# Patient Record
Sex: Female | Born: 1975 | Race: Asian | Hispanic: No | Marital: Married | Smoking: Never smoker
Health system: Southern US, Community
[De-identification: ages and names within clinical notes are randomized; demographics above are authoritative.]

## PROBLEM LIST (undated history)

## (undated) DIAGNOSIS — F411 Generalized anxiety disorder: Secondary | ICD-10-CM

## (undated) DIAGNOSIS — G4700 Insomnia, unspecified: Secondary | ICD-10-CM

## (undated) HISTORY — DX: Insomnia, unspecified: G47.00

## (undated) HISTORY — DX: Generalized anxiety disorder: F41.1

---

## 2016-11-03 ENCOUNTER — Other Ambulatory Visit: Payer: Self-pay | Admitting: Nurse Practitioner

## 2016-11-03 DIAGNOSIS — Z1231 Encounter for screening mammogram for malignant neoplasm of breast: Secondary | ICD-10-CM

## 2016-12-02 ENCOUNTER — Ambulatory Visit (INDEPENDENT_AMBULATORY_CARE_PROVIDER_SITE_OTHER): Payer: BLUE CROSS/BLUE SHIELD | Admitting: Neurology

## 2016-12-02 ENCOUNTER — Encounter: Payer: Self-pay | Admitting: Neurology

## 2016-12-02 VITALS — BP 119/80 | HR 72 | Resp 20 | Ht 60.0 in | Wt 96.0 lb

## 2016-12-02 DIAGNOSIS — R002 Palpitations: Secondary | ICD-10-CM

## 2016-12-02 DIAGNOSIS — R51 Headache: Secondary | ICD-10-CM | POA: Diagnosis not present

## 2016-12-02 DIAGNOSIS — G478 Other sleep disorders: Secondary | ICD-10-CM | POA: Diagnosis not present

## 2016-12-02 DIAGNOSIS — R351 Nocturia: Secondary | ICD-10-CM

## 2016-12-02 DIAGNOSIS — G479 Sleep disorder, unspecified: Secondary | ICD-10-CM | POA: Diagnosis not present

## 2016-12-02 DIAGNOSIS — F419 Anxiety disorder, unspecified: Secondary | ICD-10-CM

## 2016-12-02 DIAGNOSIS — R519 Headache, unspecified: Secondary | ICD-10-CM

## 2016-12-02 DIAGNOSIS — R0789 Other chest pain: Secondary | ICD-10-CM

## 2016-12-02 NOTE — Progress Notes (Signed)
Subjective:    Patient ID: Colleen Doyle is a 41 y.o. female.  HPI     Huston Foley, MD, PhD [facility] [address] [address] [place], [place] [address]  Dear Colleen Doyle,   I saw your patient, Colleen Doyle, upon your kind request in my neurologic clinic today for initial consultation of her sleep disorder, including restless sleep, nonrestorative sleep, difficulty with sleep initiation and sleep maintenance. The patient is unaccompanied today. As you know, Colleen Doyle is a 41 year old right-handed woman with an underlying medical history of palpitation and anxiety, otherwise benign medical history, who reports not being a good sleeper for quite some time, probably years, but symptoms have been worse more recently in the past couple of months, she has had consistent problems with her sleep, she worries about not being able to sleep and she also has had other symptoms including chest pressure, palpitations, discomfort and mild morning headaches. She has nocturia about once or twice per night. She has eliminated caffeine. She does not smoke or drink alcohol. Her Epworth sleepiness score is 1 out of 24, her fatigue score is 45 out of 63. She does endorse stressors. In the past 4 years she has had more stress. She moved from [place]. She has been trying to learn [place]. 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 She is in the 2 year program currently, has interest in healthcare and perhaps becoming a physical therapist eventually. She has one 81 year old daughter. She goes to school 4 days a week between [facility] and her English classes. She tries to keep a consistent bedtime at 9 PM. About a month ago she started taking medication for sleep. She was tried on trazodone first for a few days, 50 mg then 100 mg but started having palpitations and nasal congestion so she stopped the medication and it also did not help. She started taking Ambien 5 mg generic and took it for  about 10 days which only helped her get about 3 hours of sleep. More recently, 2 nights ago she started Ambien CR generic, 6.25 mg strength, the first night she slept 4 hours and last night she slept 5 hours, reasonably well. She denies restless leg symptoms or leg twitching at night or parasomnias otherwise but is a restless sleeper and tosses and turns a lot. She is not noted to snore very heavily at all. In the past some years ago she tried melatonin 5 mg strength and took it for about a week which did not help. She has also tried p.m. type medications and NyQuil over-the-counter. Wakeup time is 5:30 AM. She has to get her daughter on the school bus. Some 2 days ago she also started medication for her anxiety, BuSpar 5 mg strength 1 pill twice daily. She is not sure if it has helped, yesterday she took it in the afternoon and then at 9 PM. I reviewed your office note from 10/29/2016, which you kindly included.   Her Past Medical History Is Significant For: Past Medical History:  Diagnosis Date  . Insomnia     Her Past Surgical History Is Significant For: No past surgical history on file.  Her Family History Is Significant For: No family history on file.  Her Social History Is Significant For: Social History   Social History  . Marital status: Single    Spouse name: N/A  . Number of children: N/A  . Years of education: N/A   Social History  Main Topics  . Smoking status: Never Smoker  . Smokeless tobacco: Never Used  . Alcohol use No  . Drug use: No  . Sexual activity: Not Asked   Other Topics Concern  . None   Social History Narrative  . None    Her Allergies Are:  Allergies  Allergen Reactions  . Sulfa Antibiotics   . Wine [Alcohol]   :   Her Current Medications Are:  Outpatient Encounter Prescriptions as of 12/02/2016  Medication Sig  . ascorbic acid (VITAMIN C) 100 MG tablet Take 100 mg by mouth daily.  . busPIRone (BUSPAR) 5 MG tablet Take 5 mg by mouth daily.   . Multiple Vitamin (MULTIVITAMIN) tablet Take 1 tablet by mouth daily.  Marland Kitchen zolpidem (AMBIEN CR) 6.25 MG CR tablet Take 6.25 mg by mouth at bedtime.  . [DISCONTINUED] calcium-vitamin D 250-100 MG-UNIT tablet Take 1 tablet by mouth 2 (two) times daily.  . [DISCONTINUED] traZODone (DESYREL) 50 MG tablet Take 50 mg by mouth at bedtime.  . [DISCONTINUED] zolpidem (AMBIEN) 5 MG tablet Take 5 mg by mouth at bedtime.   No facility-administered encounter medications on file as of 12/02/2016.   :  Review of Systems:  Out of a complete 14 point review of systems, all are reviewed and negative with the exception of these symptoms as listed below:  Review of Systems  Neurological: Positive for dizziness, weakness and headaches.       Pt presents today to discuss her lack of sleep. Pt says that she has had problems for the past 2 months with not sleeping well.  Epworth Sleepiness Scale 0= would never doze 1= slight chance of dozing 2= moderate chance of dozing 3= high chance of dozing  Sitting and reading: 0 Watching TV: 0 Sitting inactive in a public place (ex. Theater or meeting): 0 As a passenger in a car for an hour without a break: 1 Lying down to rest in the afternoon: 0 Sitting and talking to someone: 0 Sitting quietly after lunch (no alcohol): 0 In a car, while stopped in traffic: 0 Total: 1   Psychiatric/Behavioral: Positive for sleep disturbance.    Objective:  Neurologic Exam  Physical Exam Physical Examination:   Vitals:   12/02/16 0841  BP: 119/80  Pulse: 72  Resp: 20   General Examination: The patient is a very pleasant 41 y.o. female in no acute distress. She appears well-developed and well-nourished and very well groomed. y.o. female in no acute distress. She appears well-developed and well-nourished and very well groomed.   HEENT: Normocephalic, atraumatic, pupils are equal, round and reactive to light and accommodation. Funduscopic exam is normal with sharp disc margins noted. Extraocular tracking is good without limitation to gaze excursion or nystagmus noted.  Normal smooth pursuit is noted. Hearing is grossly intact. Face is symmetric with normal facial animation and normal facial sensation. Speech is clear with no dysarthria noted. There is no hypophonia. There is no lip, neck/head, jaw or voice tremor. Neck is supple with full range of passive and active motion. There are no carotid bruits on auscultation. Oropharynx exam reveals: mild mouth dryness, good dental hygiene and mild airway crowding, due to smaller airway, small tonsils. Mallampati is class I. Tongue protrudes centrally and palate elevates symmetrically. Neck size is 11.5 inches. She has a Mild overbite.   Chest: Clear to auscultation without wheezing, rhonchi or crackles noted.  Heart: S1+S2+0, regular and normal without murmurs, rubs or gallops noted.   Abdomen: Soft, non-tender and non-distended with normal bowel sounds appreciated on auscultation.  Extremities: There is no pitting  edema in the distal lower extremities bilaterally. Pedal pulses are intact.  Skin: Warm and dry without trophic changes noted.  Musculoskeletal: exam reveals no obvious joint deformities, tenderness or joint swelling or erythema.   Neurologically:  Mental status: The patient is awake, alert and oriented in all 4 spheres. Her immediate and remote memory, attention, language skills and fund of knowledge are appropriate. There is no evidence of aphasia, agnosia, apraxia or anomia. Speech is clear with normal prosody and enunciation. Thought process is linear. Mood is normal and affect is anxious.  Cranial nerves II - XII are as described above under HEENT exam. In addition: shoulder shrug is normal with equal shoulder height noted. Motor exam: Normal bulk, strength and tone is noted. There is no drift, tremor or rebound. Romberg is negative. Reflexes are 2+ throughout. Babinski: Toes are flexor bilaterally. Fine motor skills and coordination: intact with normal finger taps, normal hand movements, normal rapid  alternating patting, normal foot taps and normal foot agility.  Cerebellar testing: No dysmetria or intention tremor on finger to nose testing. Heel to shin is unremarkable bilaterally. There is no truncal or gait ataxia.  Sensory exam: intact to light touch, pinprick, vibration, temperature sense in the upper and lower extremities.  Gait, station and balance: She stands easily. No veering to one side is noted. No leaning to one side is noted. Posture is age-appropriate and stance is narrow based. Gait shows normal stride length and normal pace. No problems turning are noted. Tandem walk is unremarkable.   Assessment and Plan:  In summary, Colleen Doyle is a very pleasant 41 y.o.-year old female with an Underlying benign medical history except palpitation and anxiety, who reports a long-standing history of difficulty with her sleep, more recent exacerbation of sleep maintenance and initiation issues, in addition, she reports palpitations at night, worsening anxiety, restless sleep and daytime tiredness. I would like to proceed with further evaluation in the form of sleep study testing. She has experienced chest discomfort intermittently. She also reports nocturia. She has woken up with a headache. The symptoms justify sleep study testing to look for an underlying organic cause for her symptoms. She can continue taking her generic Ambien CR which she started just a few days ago. She is advised to try to take her BuSpar in the morning and afternoon rather than afternoon and night. Her physical exam and neurological exam are nonfocal and she is reassured in that regard. We talked about maintaining good sleep hygiene. We talked about stressors. Addressing her stress and anxiety will likely help.  I will see her back after sleep study testing is completed. I answered all her questions today and she was in agreement with the plan.  Thank you very much for allowing me to participate in the care of this nice patient. If  I can be of any further assistance to you please do not hesitate to call me at [phone].  Sincerely,   Huston FoleySaima Brayln Duque, MD, PhD

## 2016-12-02 NOTE — Patient Instructions (Addendum)
Please remember to try to maintain good sleep hygiene, which means: Keep a regular sleep and wake schedule, try not to exercise or have a meal within 2 hours of your bedtime, try to keep your bedroom conducive for sleep, that is, cool and dark, without light distractors such as an illuminated alarm clock, and refrain from watching TV right before sleep or in the middle of the night and do not keep the TV or radio on during the night. Also, try not to use or play on electronic devices at bedtime, such as your cell phone, tablet PC or laptop. If you like to read at bedtime on an electronic device, try to dim the background light as much as possible. Do not eat in the middle of the night.   We will request a sleep study. We will call your you to schedule.     We will look for leg twitching and snoring or sleep apnea.   You can continue your Ambien CR 6.25 mg at night as needed; you can bring the medication for your sleep study.   Try taking your buspirone 5 mg in morning and afternoon.   For chronic insomnia, cognitive behavioral therapy through a psychiatrist and/or sleep psychologist is indicated and proven to be successful as a first line therapy.   We will call you with the sleep study results and make a follow up appointment if needed.

## 2016-12-21 ENCOUNTER — Institutional Professional Consult (permissible substitution): Payer: BLUE CROSS/BLUE SHIELD | Admitting: Neurology

## 2017-01-15 ENCOUNTER — Ambulatory Visit (INDEPENDENT_AMBULATORY_CARE_PROVIDER_SITE_OTHER): Payer: BLUE CROSS/BLUE SHIELD | Admitting: Neurology

## 2017-01-15 DIAGNOSIS — G478 Other sleep disorders: Secondary | ICD-10-CM | POA: Diagnosis not present

## 2017-01-15 DIAGNOSIS — G4720 Circadian rhythm sleep disorder, unspecified type: Secondary | ICD-10-CM

## 2017-01-22 NOTE — Progress Notes (Signed)
Patient referred by Ms. Anderson, NP, seen by me on 12/02/16, diagnostic PSG on 01/15/17.   Please call and notify the patient that the recent sleep study did not show any significant obstructive sleep apnea, no significant snoring, no significant desaturations, no leg movements or parasomnias. Benign study, achieved all stages of sleep. She can follow up with her referring provider. Please remind patient to try to maintain good sleep hygiene, which means: Keep a regular sleep and wake schedule, try not to exercise or have a meal within 2 hours of your bedtime, try to keep your bedroom conducive for sleep, that is, cool and dark, without light distractors such as an illuminated alarm clock, and refrain from watching TV right before sleep or in the middle of the night and do not keep the TV or radio on during the night. Also, try not to use or play on electronic devices at bedtime, such as your cell phone, tablet PC or laptop. If you like to read at bedtime on an electronic device, try to dim the background light as much as possible. Do not eat in the middle of the night.   Once you have spoken to patient, you can close this encounter.   Thanks,  Huston Foley, MD, PhD [facility])

## 2017-01-22 NOTE — Procedures (Signed)
PATIENT'S NAME:  Colleen Doyle, Colleen Doyle DOB:      1976-01-17      MR#:    [id number]     DATE OF RECORDING: 01/15/2017 REFERRING M.D.:  Elizabeth Palau FNP Study Performed:   Baseline Polysomnogram HISTORY: 41 year old woman with a medical history of palpitation and anxiety, who reports not being a good sleeper for quite some time, probably years, but symptoms have been worse more recently. Other symptoms include chest pressure, palpitations, discomfort and mild morning headaches. She has nocturia about once or twice per night. She has eliminated caffeine. She does not smoke or drink alcohol. Her Epworth sleepiness score is 1 out of 24, her fatigue score is 45 out of 63. The patient's weight 97 pounds with a height of 60 (inches), resulting in a BMI of 19. kg/m2. The patient's neck circumference measured 11.5 inches.  CURRENT MEDICATIONS: Buspar, Ambien   PROCEDURE:  This is a multichannel digital polysomnogram utilizing the Somnostar 11.2 system.  Electrodes and sensors were applied and monitored per AASM Specifications.   EEG, EOG, Chin and Limb EMG, were sampled at 200 Hz.  ECG, Snore and Nasal Pressure, Thermal Airflow, Respiratory Effort, CPAP Flow and Pressure, Oximetry was sampled at 50 Hz. Digital video and audio were recorded.      BASELINE STUDY  Lights Out was at 22:41 and Lights On at 05:02.  Total recording time (TRT) was 381.5 minutes, with a total sleep time (TST) of  318.5 minutes.   The patient's sleep latency was 13 minutes, which is normal.  REM latency was 83.5 minutes which is normal.  The sleep efficiency was 83.5 %.     SLEEP ARCHITECTURE: WASO (Wake after sleep onset) was 53.5 minutes with mild to moderate sleep fragmentation noted.  There were 57.5 minutes in Stage N1, 148.5 minutes Stage N2, 49 minutes Stage N3 and 63.5 minutes in Stage REM.  The percentage of Stage N1 was 18.1%, which is increased, Stage N2 was 46.6%, which is normal, Stage N3 was 15.4%, which is normal and Stage R (REM  sleep) was 19.9%, which is normal.   The arousals were noted as: 51 were spontaneous, 0 were associated with PLMs, 0 were associated with respiratory events.    Audio and video analysis did not show any abnormal or unusual movements, behaviors, phonations or vocalizations.  The patient took 1 bathroom break. No significant snoring was noted. The EKG was in keeping with normal sinus rhythm (NSR).  RESPIRATORY ANALYSIS:  There were a total of 0 respiratory events:  0 obstructive apneas, 0 central apneas and 0 mixed apneas with a total of 0 apneas and an apnea index (AI) of 0 /hour. There were 0 hypopneas with a hypopnea index of 0 /hour. The patient also had 0 respiratory event related arousals (RERAs).      The total APNEA/HYPOPNEA INDEX (AHI) was 0/hour and the total RESPIRATORY DISTURBANCE INDEX was 0 /hour.  0 events occurred in REM sleep and 0 events in NREM. The REM AHI was 0 /hour, versus a non-REM AHI of 0. The patient spent 98 minutes of total sleep time in the supine position and 221 minutes in non-supine.. The supine AHI was 0.0 versus a non-supine AHI of 0.0.  OXYGEN SATURATION & C02:  The Wake baseline 02 saturation was 98%, with the lowest being 94%. Time spent below 89% saturation equaled 0 minutes.  PERIODIC LIMB MOVEMENTS: The patient had a total of 0 Periodic Limb Movements.  The Periodic Limb Movement (PLM) index  was 0 and the PLM Arousal index was 0/hour.  Post-study, the patient indicated that sleep was the same as usual.   IMPRESSION:  1. Dysfunctions associated with sleep stages or arousal from sleep  RECOMMENDATIONS:  1. This study does not demonstrate any significant obstructive or central sleep disordered breathing, no significant snoring, no significant desaturations, no leg movements or parasomnias. This study does not support an intrinsic sleep disorder as a cause of the patient's symptoms. Other causes, including circadian rhythm disturbances, an underlying mood  disorder, medication effect and/or an underlying medical problem cannot be ruled out. 2. This study shows some sleep fragmentation and mildly abnormal sleep stage percentages; these are nonspecific findings and per se do not signify an intrinsic sleep disorder or a cause for the patient's sleep-related symptoms. Causes include (but are not limited to) the first night effect of the sleep study, circadian rhythm disturbances, medication effect or an underlying mood disorder (with possible sleep state misperception) or medical problem.  3. The patient should be cautioned not to drive, work at heights, or operate dangerous or heavy equipment when tired or sleepy. Review and reiteration of good sleep hygiene measures should be pursued with any patient. 4. The patient can follow-up with her referring provider, who will be notified of the test results.  I certify that I have reviewed the entire raw data recording prior to the issuance of this report in accordance with the Standards of Accreditation of the American Academy of Sleep Medicine (AASM)   Huston Foley, MD, PhD Diplomat, American Board of Psychiatry and Neurology (Neurology and Sleep Medicine)

## 2017-01-27 ENCOUNTER — Telehealth: Payer: Self-pay

## 2017-01-27 NOTE — Telephone Encounter (Signed)
I called pt. I advised pt that Dr. Frances Furbish reviewed pt's sleep study and found that pt did not have any significant sleep apnea, no snoring, desaturations, leg movements, or parasomnias. Pt achieved all stages of sleep and can follow up with the referring provider. I reviewed sleep hygiene recommendations with the pt, including trying to keep a regular sleep wake schedule, avoiding electronics in the bedroom, keeping the bedroom cool, dark, and quiet, and avoiding eating or exercising within 2 hours of bedtime as well as eating in the middle of the night. I advised pt that a copy of these sleep study results will be sent to Elizabeth Palau, NP. Pt asked that I mail her a copy of her sleep study and I confirmed that the address we have on file is correct. Pt verbalized understanding of results. Pt had no questions at this time but was encouraged to call back if questions arise.

## 2017-01-27 NOTE — Telephone Encounter (Signed)
-----   Message from Huston Foley, MD sent at 01/22/2017 12:57 PM EDT ----- Patient referred by Ms. Anderson, NP, seen by me on 12/02/16, diagnostic PSG on 01/15/17.   Please call and notify the patient that the recent sleep study did not show any significant obstructive sleep apnea, no significant snoring, no significant desaturations, no leg movements or parasomnias. Benign study, achieved all stages of sleep. She can follow up with her referring provider. Please remind patient to try to maintain good sleep hygiene, which means: Keep a regular sleep and wake schedule, try not to exercise or have a meal within 2 hours of your bedtime, try to keep your bedroom conducive for sleep, that is, cool and dark, without light distractors such as an illuminated alarm clock, and refrain from watching TV right before sleep or in the middle of the night and do not keep the TV or radio on during the night. Also, try not to use or play on electronic devices at bedtime, such as your cell phone, tablet PC or laptop. If you like to read at bedtime on an electronic device, try to dim the background light as much as possible. Do not eat in the middle of the night.   Once you have spoken to patient, you can close this encounter.   Thanks,  Huston Foley, MD, PhD [facility])

## 2017-03-03 ENCOUNTER — Ambulatory Visit
Admission: RE | Admit: 2017-03-03 | Discharge: 2017-03-03 | Disposition: A | Discharge status: Home / Self Care | Payer: BLUE CROSS/BLUE SHIELD | Source: Ambulatory Visit | Attending: Nurse Practitioner | Admitting: Nurse Practitioner

## 2017-03-03 DIAGNOSIS — Z1231 Encounter for screening mammogram for malignant neoplasm of breast: Secondary | ICD-10-CM

## 2018-01-24 ENCOUNTER — Other Ambulatory Visit: Payer: Self-pay | Admitting: Nurse Practitioner

## 2018-01-24 DIAGNOSIS — Z1231 Encounter for screening mammogram for malignant neoplasm of breast: Secondary | ICD-10-CM

## 2018-03-08 ENCOUNTER — Ambulatory Visit
Admission: RE | Admit: 2018-03-08 | Discharge: 2018-03-08 | Disposition: A | Discharge status: Home / Self Care | Payer: BLUE CROSS/BLUE SHIELD | Source: Ambulatory Visit | Attending: Nurse Practitioner | Admitting: Nurse Practitioner

## 2018-03-08 DIAGNOSIS — Z1231 Encounter for screening mammogram for malignant neoplasm of breast: Secondary | ICD-10-CM

## 2019-01-26 ENCOUNTER — Other Ambulatory Visit: Payer: Self-pay | Admitting: Nurse Practitioner

## 2019-01-26 DIAGNOSIS — Z1231 Encounter for screening mammogram for malignant neoplasm of breast: Secondary | ICD-10-CM

## 2019-03-27 ENCOUNTER — Other Ambulatory Visit: Payer: Self-pay

## 2019-03-27 ENCOUNTER — Ambulatory Visit
Admission: RE | Admit: 2019-03-27 | Discharge: 2019-03-27 | Disposition: A | Discharge status: Home / Self Care | Payer: BC Managed Care – PPO | Source: Ambulatory Visit | Attending: Nurse Practitioner | Admitting: Nurse Practitioner

## 2019-03-27 DIAGNOSIS — Z1231 Encounter for screening mammogram for malignant neoplasm of breast: Secondary | ICD-10-CM

## 2019-12-22 ENCOUNTER — Ambulatory Visit: Payer: Medicaid Other | Attending: Internal Medicine

## 2019-12-22 DIAGNOSIS — Z23 Encounter for immunization: Secondary | ICD-10-CM

## 2019-12-22 NOTE — Progress Notes (Signed)
   Covid-19 Vaccination Clinic  Name:  Maymuna Detzel    MRN: [id number] DOB: 27-Oct-1975  12/22/2019  Ms. Bale was observed post Covid-19 immunization for 15 minutes without incident. She was provided with Vaccine Information Sheet and instruction to access the V-Safe system.   Ms. Romanek was instructed to call 911 with any severe reactions post vaccine: Marland Kitchen Difficulty breathing  . Swelling of face and throat  . A fast heartbeat  . A bad rash all over body  . Dizziness and weakness   Immunizations Administered    Name Date Dose VIS Date Route   Pfizer COVID-19 Vaccine 12/22/2019 10:41 AM 0.3 mL 09/22/2019 Intramuscular   Manufacturer: [employer], [employer]   Lot: [id number]   NDC: [address]-7

## 2020-01-16 ENCOUNTER — Ambulatory Visit: Payer: Medicaid Other | Attending: Internal Medicine

## 2020-01-16 DIAGNOSIS — Z23 Encounter for immunization: Secondary | ICD-10-CM

## 2020-01-16 NOTE — Progress Notes (Signed)
   Covid-19 Vaccination Clinic  Name:  Colleen Doyle    MRN: [id number] DOB: [birth year]  01/16/2020  [name] was observed post Covid-19 immunization for 30 minutes based on pre-vaccination screening    Covid-19 Vaccination Clinic  Name:  Colleen Doyle    MRN: [id number] DOB: 1976/05/11  01/16/2020  [name] was observed post Covid-19 immunization for 15 minutes without incident. She was provided with Vaccine Information Sheet and instruction to access the V-Safe system.   [name] was instructed to call 911 with any severe reactions post vaccine: Marland Kitchen Difficulty breathing  . Swelling of face and throat  . A fast heartbeat  . A bad rash all over body  . Dizziness and weakness   Immunizations Administered    Name Date Dose VIS Date Route   Pfizer COVID-19 Vaccine 01/16/2020  9:02 AM 0.3 mL 09/22/2019 Intramuscular   Manufacturer: [employer], [employer]   Lot: [id number]   NDC: [address]-8     without incident. She was provided with Vaccine Information Sheet and instruction to access the V-Safe system.   [name] was instructed to call 911 with any severe reactions post vaccine: Marland Kitchen Difficulty breathing  . Swelling of face and throat  . A fast heartbeat  . A bad rash all over body  . Dizziness and weakness   Immunizations Administered    Name Date Dose VIS Date Route   Pfizer COVID-19 Vaccine 01/16/2020  9:02 AM 0.3 mL 09/22/2019 Intramuscular   Manufacturer: [employer], [employer]   Lot: [id number]   NDC: [address]-6

## 2020-02-27 ENCOUNTER — Other Ambulatory Visit: Payer: Self-pay | Admitting: Nurse Practitioner

## 2020-02-27 DIAGNOSIS — Z1231 Encounter for screening mammogram for malignant neoplasm of breast: Secondary | ICD-10-CM

## 2020-03-29 ENCOUNTER — Other Ambulatory Visit: Payer: Self-pay

## 2020-03-29 ENCOUNTER — Ambulatory Visit
Admission: RE | Admit: 2020-03-29 | Discharge: 2020-03-29 | Disposition: A | Discharge status: Home / Self Care | Payer: Medicaid Other | Source: Ambulatory Visit | Attending: Nurse Practitioner | Admitting: Nurse Practitioner

## 2020-03-29 DIAGNOSIS — Z1231 Encounter for screening mammogram for malignant neoplasm of breast: Secondary | ICD-10-CM

## 2020-04-03 ENCOUNTER — Other Ambulatory Visit: Payer: Self-pay

## 2020-04-03 DIAGNOSIS — Z1231 Encounter for screening mammogram for malignant neoplasm of breast: Secondary | ICD-10-CM

## 2020-04-30 ENCOUNTER — Ambulatory Visit: Payer: Medicaid Other

## 2020-05-16 ENCOUNTER — Other Ambulatory Visit: Payer: Self-pay

## 2020-05-16 ENCOUNTER — Ambulatory Visit
Admission: RE | Admit: 2020-05-16 | Discharge: 2020-05-16 | Disposition: A | Discharge status: Home / Self Care | Payer: No Typology Code available for payment source | Source: Ambulatory Visit | Attending: Obstetrics and Gynecology | Admitting: Obstetrics and Gynecology

## 2020-05-16 ENCOUNTER — Ambulatory Visit: Payer: Self-pay | Admitting: *Deleted

## 2020-05-16 VITALS — BP 104/78 | Temp 97.7°F | Wt 99.8 lb

## 2020-05-16 DIAGNOSIS — Z1231 Encounter for screening mammogram for malignant neoplasm of breast: Secondary | ICD-10-CM

## 2020-05-16 DIAGNOSIS — Z1239 Encounter for other screening for malignant neoplasm of breast: Secondary | ICD-10-CM

## 2020-05-16 NOTE — Patient Instructions (Addendum)
Explained breast self awareness with Peter Jobin. Patient did not need a Pap smear today due to last Pap smear and HPV typing was 08/11/2019. Let her know [facility] will cover Pap smears and HPV typing every 5 years unless has a history of abnormal Pap smears. [facility]erred [facility] to the [facility] for a screening mammogram. Appointment scheduled Thursday, May 16, 2020 at 1030 on the mobile unit. Patient directed to mobile unit following [facility] appointment. Let patient know the Breast Center will follow up with her within the next couple weeks with results of her mammogram by letter or phone. Joylyn Calender verbalized understanding.  Zyshawn Bohnenkamp, Kathaleen Maser, RN 11:20 AM

## 2020-05-16 NOTE — Progress Notes (Signed)
[name] is a 44 y.o. female who presents to [facility] clinic today with no complaints.    Pap Smear: Pap not smear completed today. Last Pap smear was 08/11/2019 at [facility] clinic and was normal with negative HPV. Per patient has no history of an abnormal Pap smear. Last Pap smear result is available in Epic.   Physical exam: Breasts Breasts symmetrical. No skin abnormalities bilateral breasts. No nipple retraction bilateral breasts. No nipple discharge bilateral breasts. No lymphadenopathy. No lumps palpated bilateral breasts. No complaint of pain or tenderness on exam.       Pelvic/Bimanual Pap is not indicated today per BCCCP guidelines.   Smoking History: Patient has never smoked.   Patient Navigation: Patient education provided. Access to services provided for patient through BCCCP program.    Breast and Cervical Cancer Risk Assessment: Patient does not have family history of breast cancer, known genetic mutations, or radiation treatment to the chest before age 49. Patient does not have history of cervical dysplasia, immunocompromised, or DES exposure in-utero.  Risk Assessment    Risk Scores      05/16/2020   Last edited by: Narda Rutherford, LPN   5-year risk: 0.6 %   Lifetime risk: 6.6 %          A: BCCCP exam without pap smear No complaints.  P: Referred patient to the [facility] for a screening mammogram on the mobile unit. Appointment scheduled Thursday, May 16, 2020 at 1030.  Priscille Heidelberg, RN 05/16/2020 11:20 AM

## 2022-05-14 IMAGING — MG DIGITAL SCREENING BILAT W/ TOMO W/ CAD
8 series · 9 of 24 positions shown · non-contrast
Comparison: Previous exam(s).

CLINICAL DATA: Screening.

EXAM:
DIGITAL SCREENING BILATERAL MAMMOGRAM WITH TOMO AND CAD

[R CC synth-2D]
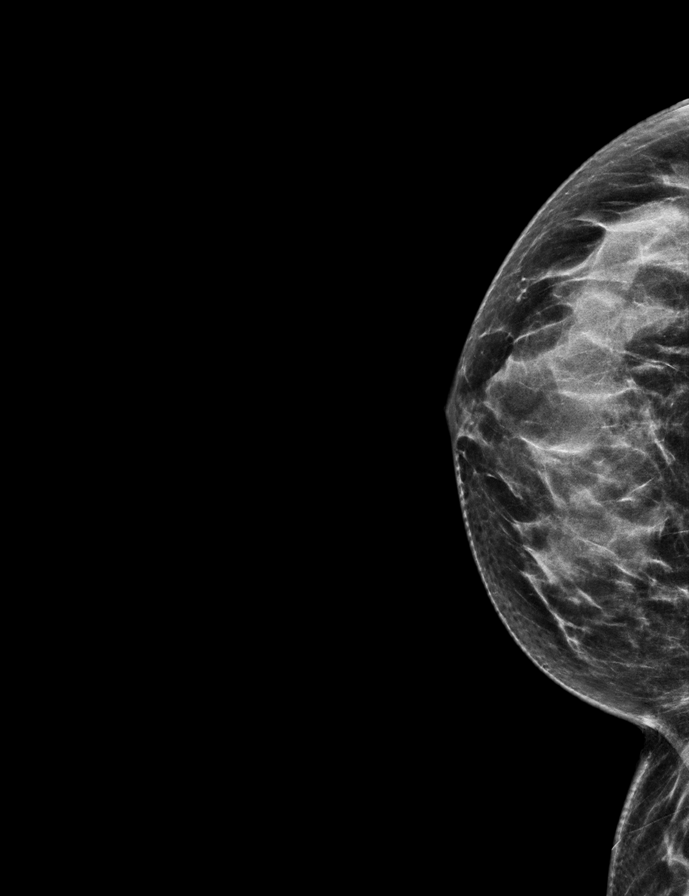

[L MLO synth-2D]
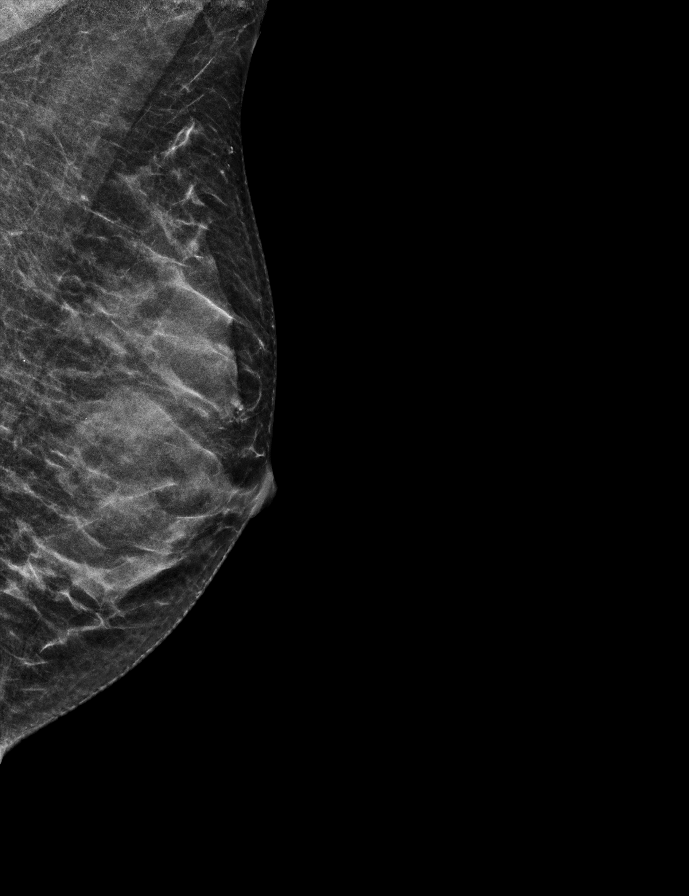

[L CC synth-2D]
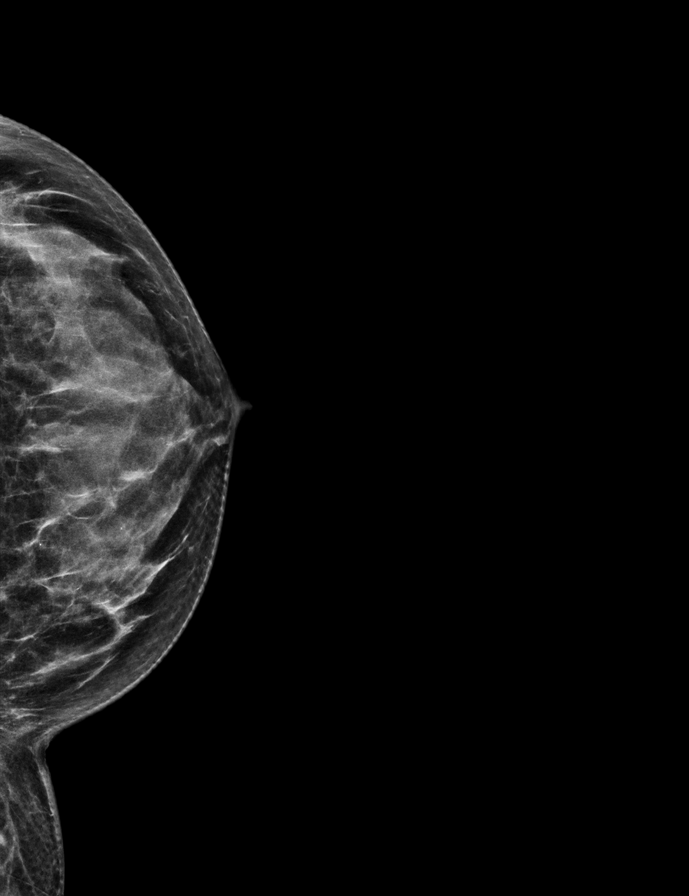

[R MLO synth-2D]
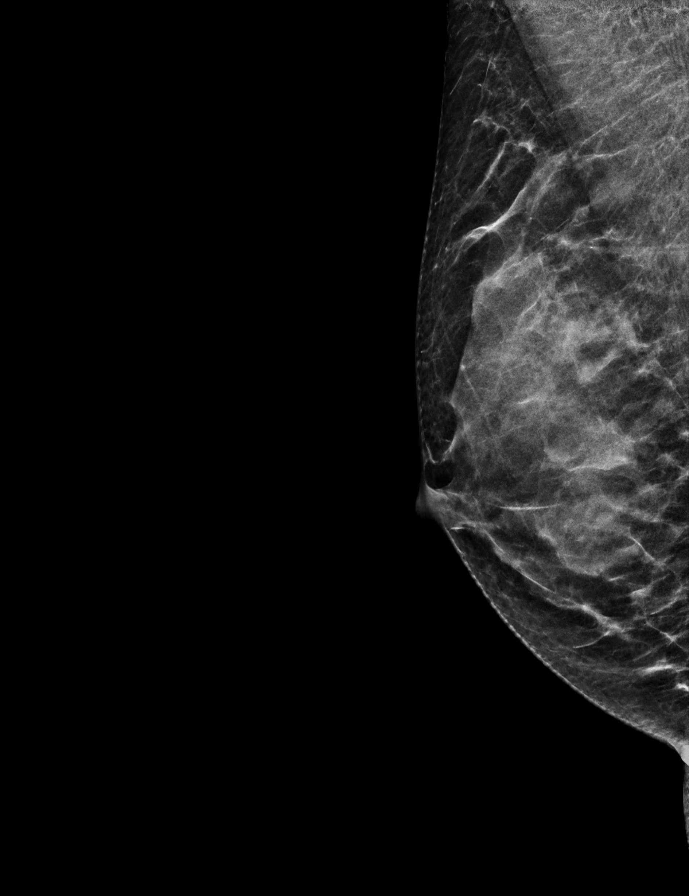

[R CC tomo · 2 of 55 frames shown]
[frame 18/55]
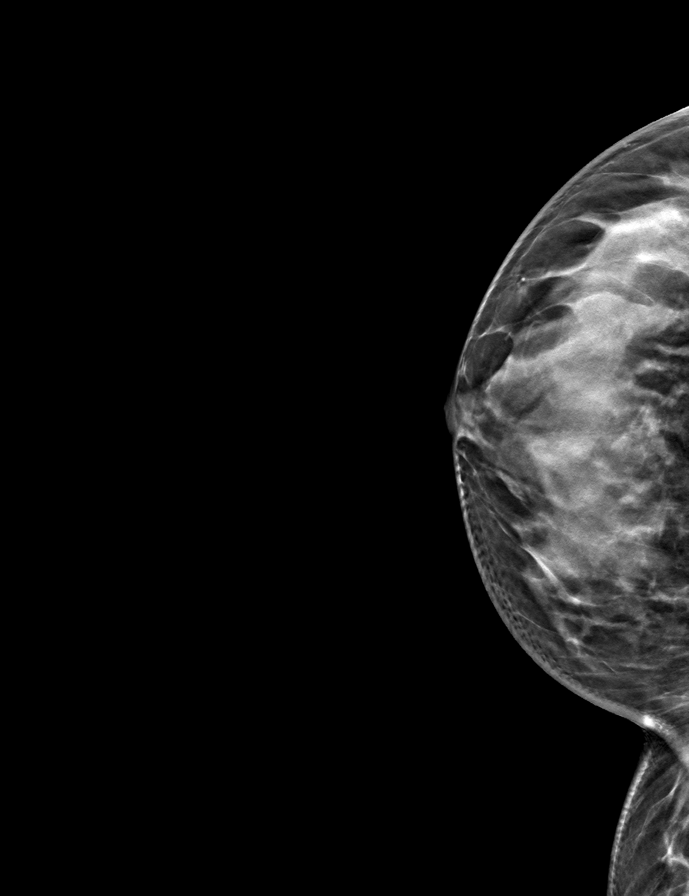
[frame 28/55]
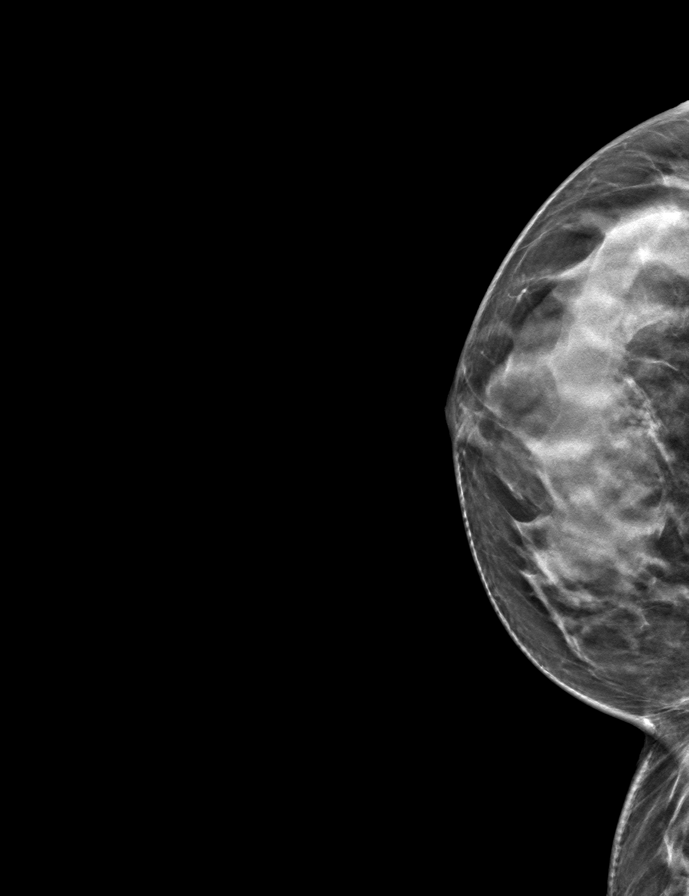

[L MLO tomo · tomo slice 21/42.0]
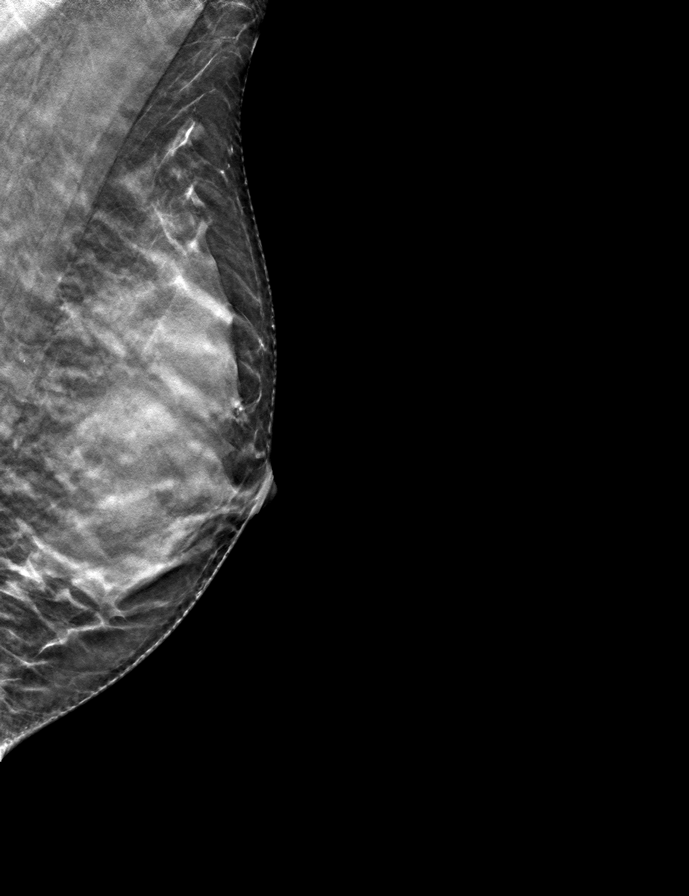

[R MLO tomo · tomo slice 21/42.0]
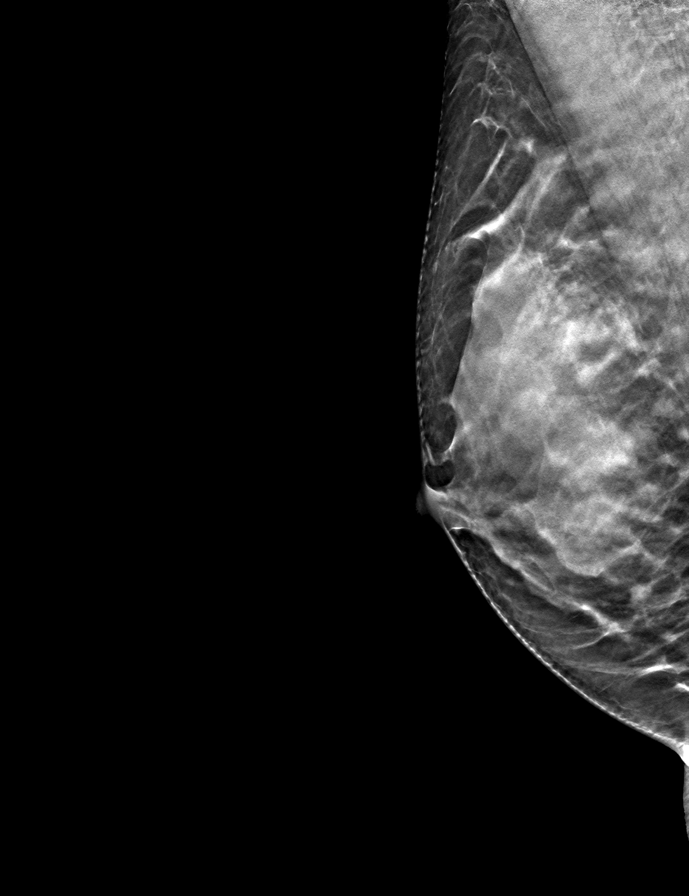

[L CC tomo · tomo slice 25/49.0]
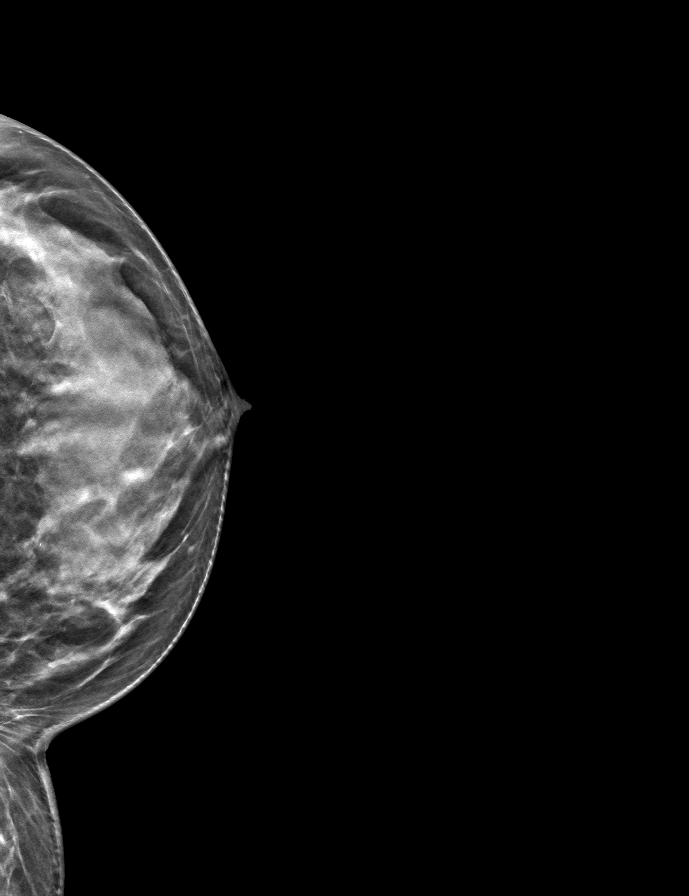

[9 of 24 positions shown; findings below may reference images not displayed]

ACR Breast Density Category d: The breast tissue is extremely dense,
which lowers the sensitivity of mammography
FINDINGS: There are no findings suspicious for malignancy. Images were
processed with CAD.
IMPRESSION: No mammographic evidence of malignancy. A result letter of this
screening mammogram will be mailed directly to the patient.

RECOMMENDATION:
Screening mammogram in one year. (Code:[id number])

BI-RADS CATEGORY  1: Negative.

## 2022-05-30 LAB — COLOGUARD: COLOGUARD: NEGATIVE

## 2023-02-01 ENCOUNTER — Ambulatory Visit: Payer: Commercial Managed Care - PPO | Admitting: Family Medicine

## 2023-02-01 ENCOUNTER — Encounter: Payer: Self-pay | Admitting: Family Medicine

## 2023-02-01 VITALS — BP 100/60 | HR 51 | Temp 98.1°F | Ht 61.0 in | Wt 103.5 lb

## 2023-02-01 DIAGNOSIS — F411 Generalized anxiety disorder: Secondary | ICD-10-CM | POA: Insufficient documentation

## 2023-02-01 DIAGNOSIS — G44211 Episodic tension-type headache, intractable: Secondary | ICD-10-CM | POA: Diagnosis not present

## 2023-02-01 MED ORDER — BUSPIRONE HCL 5 MG PO TABS: 5.0000 mg | ORAL_TABLET | Freq: Two times a day (BID) | ORAL | 1 refills | Status: DC

## 2023-02-01 NOTE — Assessment & Plan Note (Signed)
Chronic.  Controlled.  Continue BuSpar 5 mg twice daily.

## 2023-02-01 NOTE — Assessment & Plan Note (Signed)
Chronic.  Doing okay on Tylenol.  Also, start magnesium 250 to 400 mg today for possible prevention.  Keep a log.  Stretching exercises.  If becoming more problematic, will do possible triptan.

## 2023-02-01 NOTE — Patient Instructions (Signed)
Welcome to [facility]! It was a pleasure meeting you today.  As discussed, Please schedule a 4 month follow up visit today.  Magnesium daily-250-400 mg daily.   PLEASE NOTE:  If you had any LAB tests please let us know if you have not heard back within a few days. You may see your results on MyChart before we have a chance to review them but we will give you a call once they are reviewed by [place]. If we ordered any REFERRALS today, please let us know if you have not heard from their office within the next week.  Let us know through MyChart if you are needing REFILLS, or have your pharmacy send [place] the request. You can also use MyChart to communicate with me or any office staff.  Please try these tips to maintain a healthy lifestyle:  Eat most of your calories during the day when you are active. Eliminate processed foods including packaged sweets (pies, cakes, cookies), reduce intake of potatoes, white bread, white pasta, and white rice. Look for whole grain options, oat flour or almond flour.  Each meal should contain half fruits/vegetables, one quarter protein, and one quarter carbs (no bigger than a computer mouse).  Cut down on sweet beverages. This includes juice, soda, and sweet tea. Also watch fruit intake, though this is a healthier sweet option, it still contains natural sugar! Limit to 3 servings daily.  Drink at least 1 glass of water with each meal and aim for at least 8 glasses per day  Exercise at least 150 minutes every week.

## 2023-02-01 NOTE — Progress Notes (Signed)
New Patient Office Visit  Subjective:  Patient ID: Colleen Doyle, female    DOB: Jun 14, 1976  Age: 47 y.o. MRN: [id number]  CC:  Chief Complaint  Patient presents with   Establish Care    Initial visit to establish care with new pcp    Headache    HPI Colleen Doyle presents for new patient.  Headache(s)  "Junious Dresser"  from [place]  Generalized anxiety disorder-new nurse/grad.  Buspar twice daily. But frequently takes only once. No SI   Headache(s)-1-2x/week  can last 1-3 days.  Tylenol occasional-helps.  +photo.  Occurs during menses.  Frontal. No aura.  No nausea and vomiting.   Runner so heart rate low.    Current Outpatient Medications:    COLLAGEN PO, Take by mouth., Disp: , Rfl:    Multiple Vitamin (MULTIVITAMIN) tablet, Take 1 tablet by mouth daily., Disp: , Rfl:    busPIRone (BUSPAR) 5 MG tablet, Take 1 tablet (5 mg total) by mouth 2 (two) times daily., Disp: 180 tablet, Rfl: 1  Past Medical History:  Diagnosis Date   GAD (generalized anxiety disorder)    Insomnia     History reviewed. No pertinent surgical history.  Family History  Problem Relation Age of Onset   Hypertension Father    Dementia Maternal Grandmother    Breast cancer Neg Hx     Social History   Socioeconomic History   Marital status: Married    Spouse name: Not on file   Number of children: 1   Years of education: Not on file   Highest education level: GED or equivalent  Occupational History   Occupation: Teacher, adult education: [employer]  Tobacco Use   Smoking status: Never   Smokeless tobacco: Never   Tobacco comments:    I don't smoke in my life at all.  Vaping Use   Vaping Use: Never used  Substance and Sexual Activity   Alcohol use: No   Drug use: No   Sexual activity: Yes    Birth control/protection: None  Other Topics Concern   Not on file  Social History Narrative   Not on file   Social Determinants of Health   Financial Resource Strain: Not on file  Food Insecurity: Not on file   Transportation Needs: No Transportation Needs (05/16/2020)   PRAPARE - Administrator, Civil Service (Medical): No    Lack of Transportation (Non-Medical): No  Physical Activity: Not on file  Stress: Not on file  Social Connections: Not on file  Intimate Partner Violence: Not on file    ROS  ROS: Gen: no fever, chills  Skin: no rash, itching ENT: no ear pain, ear drainage, nasal congestion, rhinorrhea, sinus pressure, sore throat Eyes: no blurry vision, double vision Resp: no cough, wheeze,SOB CV: no CP, palpitations, LE edema,  GI: no heartburn, n/v/d/c, abd pain GU: no dysuria, urgency, frequency, hematuria.  26 day cycle.  MSK: no joint pain, myalgias, back pain Neuro:HPI Psych: HPI  Objective:   Today's Vitals: BP 100/60   Pulse (!) 51   Temp 98.1 F (36.7 C) (Temporal)   Ht  (1.549 m)   Wt 103 lb 8 oz (46.9 kg)   LMP 01/30/2023 (Exact Date)   SpO2 98%   BMI 19.56 kg/m   Physical Exam  Gen: WDWN NAD HEENT: NCAT, conjunctiva not injected, sclera nonicteric NECK:  supple, no thyromegaly, no nodes, no carotid bruits CARDIAC:bradycardia RRR, S1S2+, no murmur. DP 2+B LUNGS: CTAB. No  wheezes ABDOMEN:  BS+, soft, NTND, No HSM, no masses EXT:  no edema MSK: no gross abnormalities.  NEURO: A&O x3.  CN II-XII intact.  PSYCH: normal mood. Good eye contact   Assessment & Plan:  GAD (generalized anxiety disorder) Assessment & Plan: Chronic.  Controlled.  Continue BuSpar 5 mg twice daily.   Intractable episodic tension-type headache Assessment & Plan: Chronic.  Doing okay on Tylenol.  Also, start magnesium 250 to 400 mg today for possible prevention.  Keep a log.  Stretching exercises.  If becoming more problematic, will do possible triptan.   Other orders -     busPIRone HCl; Take 1 tablet (5 mg total) by mouth 2 (two) times daily.  Dispense: 180 tablet; Refill: 1    Follow-up: Return in about 4 months (around 06/03/2023) for annual physical.    Angelena Sole, MD

## 2023-05-21 ENCOUNTER — Encounter: Payer: Commercial Managed Care - PPO | Admitting: Family Medicine

## 2023-05-27 ENCOUNTER — Encounter (INDEPENDENT_AMBULATORY_CARE_PROVIDER_SITE_OTHER): Payer: Self-pay

## 2023-06-16 ENCOUNTER — Ambulatory Visit (INDEPENDENT_AMBULATORY_CARE_PROVIDER_SITE_OTHER): Payer: Commercial Managed Care - PPO | Admitting: Family Medicine

## 2023-06-16 ENCOUNTER — Encounter: Payer: Self-pay | Admitting: Family Medicine

## 2023-06-16 VITALS — BP 100/70 | HR 62 | Temp 98.2°F | Resp 16 | Ht 61.0 in | Wt 99.5 lb

## 2023-06-16 DIAGNOSIS — Z0000 Encounter for general adult medical examination without abnormal findings: Secondary | ICD-10-CM | POA: Diagnosis not present

## 2023-06-16 DIAGNOSIS — Z1159 Encounter for screening for other viral diseases: Secondary | ICD-10-CM | POA: Diagnosis not present

## 2023-06-16 LAB — LIPID PANEL
Cholesterol: 260 mg/dL — ABNORMAL HIGH (ref 0–200)
HDL: 97 mg/dL (ref >39.00)
LDL Cholesterol: 151 mg/dL — ABNORMAL HIGH (ref 0–99)
NonHDL: 163.03
Total CHOL/HDL Ratio: 3
Triglycerides: 59 mg/dL (ref 0.0–149.0)
VLDL: 11.8 mg/dL (ref 0.0–40.0)

## 2023-06-16 LAB — CBC WITH DIFFERENTIAL/PLATELET
Basophils Absolute: 0 10*3/uL (ref 0.0–0.1)
Basophils Relative: 0.9 % (ref 0.0–3.0)
Eosinophils Absolute: 0.1 10*3/uL (ref 0.0–0.7)
Eosinophils Relative: 2.1 % (ref 0.0–5.0)
HCT: 41 % (ref 36.0–46.0)
Hemoglobin: 13.3 g/dL (ref 12.0–15.0)
Lymphocytes Relative: 37.4 % (ref 12.0–46.0)
Lymphs Abs: 1.4 10*3/uL (ref 0.7–4.0)
MCHC: 32.6 g/dL (ref 30.0–36.0)
MCV: 93.7 fl (ref 78.0–100.0)
Monocytes Absolute: 0.3 10*3/uL (ref 0.1–1.0)
Monocytes Relative: 7 % (ref 3.0–12.0)
Neutro Abs: 2 10*3/uL (ref 1.4–7.7)
Neutrophils Relative %: 52.6 % (ref 43.0–77.0)
Platelets: 228 10*3/uL (ref 150.0–400.0)
RBC: 4.37 Mil/uL (ref 3.87–5.11)
RDW: 13.2 % (ref 11.5–15.5)
WBC: 3.7 10*3/uL — ABNORMAL LOW (ref 4.0–10.5)

## 2023-06-16 LAB — COMPREHENSIVE METABOLIC PANEL
ALT: 18 U/L (ref 0–35)
AST: 21 U/L (ref 0–37)
Albumin: 4.3 g/dL (ref 3.5–5.2)
Alkaline Phosphatase: 39 U/L (ref 39–117)
BUN: 13 mg/dL (ref 6–23)
CO2: 28 meq/L (ref 19–32)
Calcium: 9.5 mg/dL (ref 8.4–10.5)
Chloride: 103 meq/L (ref 96–112)
Creatinine, Ser: 0.74 mg/dL (ref 0.40–1.20)
GFR: 96.73 mL/min (ref >60.00)
Glucose, Bld: 86 mg/dL (ref 70–99)
Potassium: 4.1 meq/L (ref 3.5–5.1)
Sodium: 138 meq/L (ref 135–145)
Total Bilirubin: 0.7 mg/dL (ref 0.2–1.2)
Total Protein: 7.5 g/dL (ref 6.0–8.3)

## 2023-06-16 LAB — HEMOGLOBIN A1C: Hgb A1c MFr Bld: 5.6 % (ref 4.6–6.5)

## 2023-06-16 LAB — VITAMIN B12: Vitamin B-12: 539 pg/mL (ref 211–911)

## 2023-06-16 LAB — VITAMIN D 25 HYDROXY (VIT D DEFICIENCY, FRACTURES): VITD: 32.89 ng/mL (ref 30.00–100.00)

## 2023-06-16 LAB — TSH: TSH: 2.2 u[IU]/mL (ref 0.35–5.50)

## 2023-06-16 MED ORDER — BUSPIRONE HCL 5 MG PO TABS: 5.0000 mg | ORAL_TABLET | Freq: Two times a day (BID) | ORAL | 1 refills | Status: DC

## 2023-06-16 NOTE — Patient Instructions (Addendum)
It was very nice to see you today!  [employer].    PLEASE NOTE:  If you had any lab tests please let us know if you have not heard back within a few days. You may see your results on MyChart before we have a chance to review them but we will give you a call once they are reviewed by [place]. If we ordered any referrals today, please let us know if you have not heard from their office within the next week.   Please try these tips to maintain a healthy lifestyle:  Eat most of your calories during the day when you are active. Eliminate processed foods including packaged sweets (pies, cakes, cookies), reduce intake of potatoes, white bread, white pasta, and white rice. Look for whole grain options, oat flour or almond flour.  Each meal should contain half fruits/vegetables, one quarter protein, and one quarter carbs (no bigger than a computer mouse).  Cut down on sweet beverages. This includes juice, soda, and sweet tea. Also watch fruit intake, though this is a healthier sweet option, it still contains natural sugar! Limit to 3 servings daily.  Drink at least 1 glass of water with each meal and aim for at least 8 glasses per day  Exercise at least 150 minutes every week.

## 2023-06-16 NOTE — Progress Notes (Signed)
Phone [phone]   Subjective:   Patient is a 47 y.o. female presenting for annual physical.    Chief Complaint  Patient presents with   Annual Exam    CPE Fasting for labs Tightness in back of neck constantly   Annual - Patient states she walks about 5 miles a day staying very active at work. Endorses stress and staying very busy due to work.  Back Tightness - She complains of neck, shoulder, and back tightness. States she will begin stretching throughout the day and expresses interests in messages/ physical therapy.   Headaches - Compliant with 100 mg magnesium at night and in the mornings. States for the past 2 months her headaches have greatly  improved.    See problem oriented charting- ROS- ROS: Gen: no fever, chills  Skin: no rash, itching ENT: no ear pain, ear drainage, nasal congestion, rhinorrhea, sinus pressure, sore throat Eyes: no blurry vision, double vision Resp: no cough, wheeze,SOB CV: no CP, palpitations, LE edema,  GI: no heartburn, n/v/d/c, abd pain GU: no dysuria, urgency, frequency, hematuria MSK: no joint pain, myalgias, +back pain Neuro: no dizziness,weakness, vertigo +headaches Psych: no depression, anxiety, insomnia, SI -moods much better on buspar  The following were reviewed and entered/updated in epic: Past Medical History:  Diagnosis Date   GAD (generalized anxiety disorder)    Insomnia    Patient Active Problem List   Diagnosis Date Noted   GAD (generalized anxiety disorder) 02/01/2023   Intractable episodic tension-type headache 02/01/2023   History reviewed. No pertinent surgical history.  Family History  Problem Relation Age of Onset   Hypertension Father    Dementia Maternal Grandmother    Breast cancer Neg Hx     Medications- reviewed and updated Current Outpatient Medications  Medication Sig Dispense Refill   Multiple Vitamin (MULTIVITAMIN) tablet Take 1 tablet by mouth daily.     busPIRone (BUSPAR) 5 MG tablet Take 1  tablet (5 mg total) by mouth 2 (two) times daily. 180 tablet 1   No current facility-administered medications for this visit.    Allergies-reviewed and updated Allergies  Allergen Reactions   Alcohol Hives    Social History   Social History Narrative   Not on file   Objective  Objective:  BP 100/70   Pulse 62   Temp 98.2 F (36.8 C) (Temporal)   Resp 16   Ht 5\' 1"  (1.549 m)   Wt 99 lb 8 oz (45.1 kg)   LMP 06/01/2023 (Exact Date)   SpO2 99%   BMI 18.80 kg/m  Physical Exam  Gen: WDWN NAD HEENT: NCAT, conjunctiva not injected, sclera nonicteric TM WNL B, OP moist, no exudates  NECK:  supple, no thyromegaly, no nodes, no carotid bruits CARDIAC: RRR, S1S2+, no murmur. DP 2+B LUNGS: CTAB. No wheezes ABDOMEN:  BS+, soft, NTND, No HSM, no masses EXT:  no edema MSK: no gross abnormalities. MS 5/5 all 4 NEURO: A&O x3.  CN II-XII intact.  PSYCH: normal mood. Good eye contact     Assessment and Plan   Health Maintenance counseling: 1. Anticipatory guidance: Patient counseled regarding regular dental exams q6 months, eye exams,  avoiding smoking and second hand smoke, limiting alcohol to 1 beverage per day, no illicit drugs.   2. Risk factor reduction:  Advised patient of need for regular exercise and diet rich and fruits and vegetables to reduce risk of heart attack and stroke. Exercise- Walks daily at work, on her feet all day.  Wt Readings  from Last 3 Encounters:  06/16/23 99 lb 8 oz (45.1 kg)  02/01/23 103 lb 8 oz (46.9 kg)  05/16/20 99 lb 12.8 oz (45.3 kg)   3. Immunizations/screenings/ancillary studies Immunization History  Administered Date(s) Administered   Influenza, Quadrivalent, Recombinant, Inj, Pf 11/15/2016   Influenza,inj,Quad PF,6-35 Mos 06/30/2019   Influenza-Unspecified 07/28/2017, 07/20/2018, 07/23/2020   PFIZER(Purple Top)SARS-COV-2 Vaccination 12/22/2019, 01/16/2020   PPD Test 10/18/2018   Tdap 10/13/2007, 07/29/2018   Health Maintenance Due   Topic Date Due   HIV Screening  Never done   Hepatitis C Screening  Never done    4. Cervical cancer screening- utd 2020 5. Breast cancer screening-  mammogram utd 2023 6. Colon cancer screening - n/a 7. Skin cancer screening- advised regular sunscreen use. Denies worrisome, changing, or new skin lesions.  8. Birth control/STD check- none 9. Osteoporosis screening- n/a 10. Smoking associated screening - non smoker  Wellness examination -     CBC with Differential/Platelet -     Comprehensive metabolic panel -     Lipid panel -     TSH -     Hemoglobin A1c -     HIV Antibody (routine testing w rflx) -     Hepatitis C antibody -     Vitamin B12 -     VITAMIN D 25 Hydroxy (Vit-D Deficiency, Fractures)  Screening for viral disease -     HIV Antibody (routine testing w rflx) -     Hepatitis C antibody  Other orders -     busPIRone HCl; Take 1 tablet (5 mg total) by mouth 2 (two) times daily.  Dispense: 180 tablet; Refill: 1   Wellness-anticipatory guidance.  Work on Diet/Exercise  Check CBC,CMP,lipids,TSH, A1C.  F/u 1 yr .  Mamm due in Dec.  Requesting D and B12-aware ins may not cover  Recommended follow up: Return in about 1 year (around 06/15/2024) for annual physical.  Lab/Order associations: fasting   I,Anaiya N Rice,acting as a scribe for Angelena Sole, MD.,have documented all relevant documentation on the behalf of Angelena Sole, MD,as directed by  Angelena Sole, MD while in the presence of Angelena Sole, MD.  I, Angelena Sole, MD, have reviewed all documentation for this visit. The documentation on 06/16/23 for the exam, diagnosis, procedures, and orders are all accurate and complete.    Angelena Sole, MD

## 2023-06-18 LAB — HEPATITIS C ANTIBODY: Hepatitis C Ab: NONREACTIVE

## 2023-06-18 LAB — HIV ANTIBODY (ROUTINE TESTING W REFLEX): HIV 1&2 Ab, 4th Generation: NONREACTIVE

## 2023-06-20 NOTE — Progress Notes (Signed)
Labs look great except: 1.  WBCs are on the low end of normal.  This may be normal for her.  Breakdown is great.  Repeat CBC differential in 2 months for pattern.(It has been 3.6, 2 years ago) 2.  Cholesterol has increased a lot.  Work on diet/exercise.  In 6 months for repeat cholesterol (do fasting lipids several days before appointment)

## 2023-06-21 ENCOUNTER — Other Ambulatory Visit: Payer: Self-pay | Admitting: *Deleted

## 2023-06-21 DIAGNOSIS — D729 Disorder of white blood cells, unspecified: Secondary | ICD-10-CM

## 2023-06-22 ENCOUNTER — Other Ambulatory Visit: Payer: Self-pay | Admitting: *Deleted

## 2023-06-22 DIAGNOSIS — E7800 Pure hypercholesterolemia, unspecified: Secondary | ICD-10-CM

## 2023-08-18 ENCOUNTER — Other Ambulatory Visit (INDEPENDENT_AMBULATORY_CARE_PROVIDER_SITE_OTHER): Payer: Commercial Managed Care - PPO

## 2023-08-18 DIAGNOSIS — D729 Disorder of white blood cells, unspecified: Secondary | ICD-10-CM

## 2023-08-18 LAB — CBC WITH DIFFERENTIAL/PLATELET
Basophils Absolute: 0 10*3/uL (ref 0.0–0.1)
Basophils Relative: 0.6 % (ref 0.0–3.0)
Eosinophils Absolute: 0.1 10*3/uL (ref 0.0–0.7)
Eosinophils Relative: 1.6 % (ref 0.0–5.0)
HCT: 35.3 % — ABNORMAL LOW (ref 36.0–46.0)
Hemoglobin: 11.7 g/dL — ABNORMAL LOW (ref 12.0–15.0)
Lymphocytes Relative: 36.2 % (ref 12.0–46.0)
Lymphs Abs: 1.5 10*3/uL (ref 0.7–4.0)
MCHC: 33 g/dL (ref 30.0–36.0)
MCV: 94 fL (ref 78.0–100.0)
Monocytes Absolute: 0.3 10*3/uL (ref 0.1–1.0)
Monocytes Relative: 7 % (ref 3.0–12.0)
Neutro Abs: 2.3 10*3/uL (ref 1.4–7.7)
Neutrophils Relative %: 54.6 % (ref 43.0–77.0)
Platelets: 220 10*3/uL (ref 150.0–400.0)
RBC: 3.76 Mil/uL — ABNORMAL LOW (ref 3.87–5.11)
RDW: 13.1 % (ref 11.5–15.5)
WBC: 4.2 10*3/uL (ref 4.0–10.5)

## 2023-08-18 NOTE — Progress Notes (Signed)
Now wbc normal but hgb dropped-does she have heavy periods, bleeding, donate blood?

## 2023-08-19 NOTE — Progress Notes (Signed)
Take vitamin w/iron and repeat cbcd 2 weeks

## 2023-08-20 ENCOUNTER — Other Ambulatory Visit: Payer: Self-pay | Admitting: *Deleted

## 2023-08-20 DIAGNOSIS — D582 Other hemoglobinopathies: Secondary | ICD-10-CM

## 2023-08-31 ENCOUNTER — Other Ambulatory Visit (INDEPENDENT_AMBULATORY_CARE_PROVIDER_SITE_OTHER): Payer: Commercial Managed Care - PPO

## 2023-08-31 DIAGNOSIS — E7800 Pure hypercholesterolemia, unspecified: Secondary | ICD-10-CM

## 2023-08-31 DIAGNOSIS — D582 Other hemoglobinopathies: Secondary | ICD-10-CM | POA: Diagnosis not present

## 2023-08-31 LAB — LIPID PANEL
Cholesterol: 257 mg/dL — ABNORMAL HIGH (ref 0–200)
HDL: 93.4 mg/dL (ref >39.00)
LDL Cholesterol: 149 mg/dL — ABNORMAL HIGH (ref 0–99)
NonHDL: 163.5
Total CHOL/HDL Ratio: 3
Triglycerides: 75 mg/dL (ref 0.0–149.0)
VLDL: 15 mg/dL (ref 0.0–40.0)

## 2023-08-31 LAB — CBC WITH DIFFERENTIAL/PLATELET
Basophils Absolute: 0 10*3/uL (ref 0.0–0.1)
Basophils Relative: 0.6 % (ref 0.0–3.0)
Eosinophils Absolute: 0.1 10*3/uL (ref 0.0–0.7)
Eosinophils Relative: 2.1 % (ref 0.0–5.0)
HCT: 39 % (ref 36.0–46.0)
Hemoglobin: 13 g/dL (ref 12.0–15.0)
Lymphocytes Relative: 27.1 % (ref 12.0–46.0)
Lymphs Abs: 1.1 10*3/uL (ref 0.7–4.0)
MCHC: 33.3 g/dL (ref 30.0–36.0)
MCV: 94.4 fL (ref 78.0–100.0)
Monocytes Absolute: 0.3 10*3/uL (ref 0.1–1.0)
Monocytes Relative: 6.7 % (ref 3.0–12.0)
Neutro Abs: 2.6 10*3/uL (ref 1.4–7.7)
Neutrophils Relative %: 63.5 % (ref 43.0–77.0)
Platelets: 226 10*3/uL (ref 150.0–400.0)
RBC: 4.13 Mil/uL (ref 3.87–5.11)
RDW: 13.6 % (ref 11.5–15.5)
WBC: 4.1 10*3/uL (ref 4.0–10.5)

## 2023-08-31 NOTE — Progress Notes (Signed)
Cbc looks good.  The last one must have been a lab thing Cholesterol needs work-work on diet/exercise-will see what it's doing in March(please put order in for cmp, lipids for Feb and cbc)

## 2023-09-01 ENCOUNTER — Other Ambulatory Visit: Payer: Self-pay | Admitting: *Deleted

## 2023-09-01 DIAGNOSIS — D729 Disorder of white blood cells, unspecified: Secondary | ICD-10-CM

## 2023-09-01 DIAGNOSIS — D582 Other hemoglobinopathies: Secondary | ICD-10-CM

## 2023-09-01 DIAGNOSIS — E7800 Pure hypercholesterolemia, unspecified: Secondary | ICD-10-CM

## 2023-11-01 ENCOUNTER — Encounter (HOSPITAL_BASED_OUTPATIENT_CLINIC_OR_DEPARTMENT_OTHER): Payer: Self-pay | Admitting: Radiology

## 2023-11-01 ENCOUNTER — Ambulatory Visit (HOSPITAL_BASED_OUTPATIENT_CLINIC_OR_DEPARTMENT_OTHER)
Admission: RE | Admit: 2023-11-01 | Discharge: 2023-11-01 | Disposition: A | Discharge status: Home / Self Care | Payer: Commercial Managed Care - PPO | Source: Ambulatory Visit | Attending: Family Medicine | Admitting: Family Medicine

## 2023-11-01 DIAGNOSIS — Z1231 Encounter for screening mammogram for malignant neoplasm of breast: Secondary | ICD-10-CM | POA: Insufficient documentation

## 2023-11-02 ENCOUNTER — Encounter: Payer: Self-pay | Admitting: Family Medicine

## 2023-11-02 LAB — HM MAMMOGRAPHY

## 2023-11-03 ENCOUNTER — Encounter: Payer: Self-pay | Admitting: Family Medicine

## 2023-12-08 ENCOUNTER — Other Ambulatory Visit: Payer: Commercial Managed Care - PPO

## 2023-12-10 ENCOUNTER — Other Ambulatory Visit: Payer: Self-pay | Admitting: Family Medicine

## 2023-12-15 ENCOUNTER — Ambulatory Visit: Payer: Commercial Managed Care - PPO | Admitting: Family Medicine

## 2024-02-09 ENCOUNTER — Encounter: Payer: Self-pay | Admitting: Family Medicine

## 2024-02-09 ENCOUNTER — Other Ambulatory Visit (INDEPENDENT_AMBULATORY_CARE_PROVIDER_SITE_OTHER): Payer: Commercial Managed Care - PPO

## 2024-02-09 DIAGNOSIS — D729 Disorder of white blood cells, unspecified: Secondary | ICD-10-CM | POA: Diagnosis not present

## 2024-02-09 DIAGNOSIS — E7800 Pure hypercholesterolemia, unspecified: Secondary | ICD-10-CM | POA: Diagnosis not present

## 2024-02-09 DIAGNOSIS — D582 Other hemoglobinopathies: Secondary | ICD-10-CM

## 2024-02-09 LAB — LIPID PANEL
Cholesterol: 237 mg/dL — ABNORMAL HIGH (ref 0–200)
HDL: 95.3 mg/dL (ref >39.00)
LDL Cholesterol: 129 mg/dL — ABNORMAL HIGH (ref 0–99)
NonHDL: 141.36
Total CHOL/HDL Ratio: 2
Triglycerides: 61 mg/dL (ref 0.0–149.0)
VLDL: 12.2 mg/dL (ref 0.0–40.0)

## 2024-02-09 LAB — CBC WITH DIFFERENTIAL/PLATELET
Basophils Absolute: 0 10*3/uL (ref 0.0–0.1)
Basophils Relative: 0.6 % (ref 0.0–3.0)
Eosinophils Absolute: 0.1 10*3/uL (ref 0.0–0.7)
Eosinophils Relative: 1.7 % (ref 0.0–5.0)
HCT: 37.1 % (ref 36.0–46.0)
Hemoglobin: 12.4 g/dL (ref 12.0–15.0)
Lymphocytes Relative: 30 % (ref 12.0–46.0)
Lymphs Abs: 1.4 10*3/uL (ref 0.7–4.0)
MCHC: 33.5 g/dL (ref 30.0–36.0)
MCV: 93.6 fl (ref 78.0–100.0)
Monocytes Absolute: 0.3 10*3/uL (ref 0.1–1.0)
Monocytes Relative: 7.2 % (ref 3.0–12.0)
Neutro Abs: 2.8 10*3/uL (ref 1.4–7.7)
Neutrophils Relative %: 60.5 % (ref 43.0–77.0)
Platelets: 208 10*3/uL (ref 150.0–400.0)
RBC: 3.96 Mil/uL (ref 3.87–5.11)
RDW: 13.1 % (ref 11.5–15.5)
WBC: 4.6 10*3/uL (ref 4.0–10.5)

## 2024-02-09 LAB — COMPREHENSIVE METABOLIC PANEL WITH GFR
ALT: 12 U/L (ref 0–35)
AST: 18 U/L (ref 0–37)
Albumin: 4.2 g/dL (ref 3.5–5.2)
Alkaline Phosphatase: 35 U/L — ABNORMAL LOW (ref 39–117)
BUN: 15 mg/dL (ref 6–23)
CO2: 28 meq/L (ref 19–32)
Calcium: 9.1 mg/dL (ref 8.4–10.5)
Chloride: 102 meq/L (ref 96–112)
Creatinine, Ser: 0.76 mg/dL (ref 0.40–1.20)
GFR: 93.25 mL/min (ref >60.00)
Glucose, Bld: 94 mg/dL (ref 70–99)
Potassium: 4.7 meq/L (ref 3.5–5.1)
Sodium: 136 meq/L (ref 135–145)
Total Bilirubin: 0.7 mg/dL (ref 0.2–1.2)
Total Protein: 7.1 g/dL (ref 6.0–8.3)

## 2024-02-09 NOTE — Progress Notes (Signed)
 Better.  Continue diet/exercise.  I will see her in august

## 2024-04-30 ENCOUNTER — Encounter: Payer: Self-pay | Admitting: Family Medicine

## 2024-05-16 ENCOUNTER — Ambulatory Visit: Admitting: Family

## 2024-05-16 ENCOUNTER — Other Ambulatory Visit (HOSPITAL_BASED_OUTPATIENT_CLINIC_OR_DEPARTMENT_OTHER): Payer: Self-pay

## 2024-05-16 ENCOUNTER — Encounter: Payer: Self-pay | Admitting: Family

## 2024-05-16 VITALS — BP 102/69 | HR 57 | Temp 97.3°F | Ht 61.0 in | Wt 98.1 lb

## 2024-05-16 DIAGNOSIS — K2900 Acute gastritis without bleeding: Secondary | ICD-10-CM | POA: Diagnosis not present

## 2024-05-16 DIAGNOSIS — R140 Abdominal distension (gaseous): Secondary | ICD-10-CM

## 2024-05-16 MED ORDER — OMEPRAZOLE 20 MG PO CPDR
20.0000 mg | DELAYED_RELEASE_CAPSULE | Freq: Every day | ORAL | 1 refills | Status: DC
  Filled 2024-05-16: qty 45, 45d supply, fill #0

## 2024-05-16 NOTE — Progress Notes (Signed)
 Patient ID: Colleen Doyle, female    DOB: 1975-12-29, 48 y.o.   MRN: [id number]  Chief Complaint  Patient presents with   Bloated    Pt c/o bloating, present for 5 months.   Discussed the use of AI scribe software for clinical note transcription with the patient, who gave verbal consent to proceed.  History of Present Illness Colleen Doyle is a 48 year old female who presents with persistent bloating and digestive issues.  Abdominal bloating and digestive symptoms - Persistent bloating and difficulty digesting food since April 2025 - Symptoms worsened during travel to [place] in June 2025 - No nausea or heartburn or indigestion sx - No difficulty with bowel movements; daily regular stools - Avoids carbonated drinks, straws, and excessive sweets - Occasionally consumes donuts or brownies at work  Appetite and weight changes - Lack of appetite, with intervals of 14 to 18 hours without feeling hungry - Concern for unintentional weight loss  Gastrointestinal diagnostic evaluation and interventions - Tested negative for H. pylori while in [place] - Esophagogastroduodenoscopy (EGD) in [place] revealed gastric polyps, which were removed - Diagnosed with chronic atrophic gastritis - Prescribed a Chinese medication for two weeks with uncertain relief  Medication use - Not taking any prescription medications regularly  Assessment & Plan Gastritis with abdominal bloating Gastritis with worsening symptoms since April. Negative H. pylori. EGD showed gastric polyps, removed. Symptoms include postprandial bloating, prolonged digestion, and anorexia. Likely due to increased gastric acid. - Prescribed omeprazole : twice daily for two weeks, then once daily, then every other day or as needed. Discussed long term PPI therapy risks including vitamin malabsorption and drug interactions. Omeprazole  chosen due to insurance coverage. - Recommended dietary modifications to reduce acidic and gas-producing  foods & drinks. - Advised over-the-counter extra strength simethicone for gas relief as needed. - Encouraged small, frequent meals and adequate hydration. - Advised regular exercise to aid gas movement. - F/U prn if above tx is not working  Subjective:    Outpatient Medications Prior to Visit  Medication Sig Dispense Refill   Multiple Vitamin (MULTIVITAMIN) tablet Take 1 tablet by mouth daily.     busPIRone  (BUSPAR ) 5 MG tablet TAKE 1 TABLET BY MOUTH 2 TIMES A DAY 180 tablet 1   No facility-administered medications prior to visit.   Past Medical History:  Diagnosis Date   GAD (generalized anxiety disorder)    Insomnia    No past surgical history on file. Allergies  Allergen Reactions   Alcohol Hives      Objective:    Physical Exam Vitals and nursing note reviewed.  Constitutional:      Appearance: Normal appearance.  Cardiovascular:     Rate and Rhythm: Normal rate and regular rhythm.  Pulmonary:     Effort: Pulmonary effort is normal.     Breath sounds: Normal breath sounds.  Musculoskeletal:        General: Normal range of motion.  Skin:    General: Skin is warm and dry.  Neurological:     Mental Status: She is alert.  Psychiatric:        Mood and Affect: Mood normal.        Behavior: Behavior normal.    BP 102/69 (BP Location: Left Arm, Patient Position: Sitting, Cuff Size: Normal)   Pulse (!) 57   Temp (!) 97.3 F (36.3 C) (Temporal)   Ht 5' 1 (1.549 m)   Wt 98 lb 2 oz (44.5 kg)   LMP 05/01/2024 (Approximate)  SpO2 100%   BMI 18.54 kg/m  Wt Readings from Last 3 Encounters:  05/16/24 98 lb 2 oz (44.5 kg)  06/16/23 99 lb 8 oz (45.1 kg)  02/01/23 103 lb 8 oz (46.9 kg)      Lucius Krabbe, NP

## 2024-05-30 ENCOUNTER — Encounter: Payer: Self-pay | Admitting: Family Medicine

## 2024-05-30 ENCOUNTER — Ambulatory Visit (INDEPENDENT_AMBULATORY_CARE_PROVIDER_SITE_OTHER): Payer: Commercial Managed Care - PPO | Admitting: Family Medicine

## 2024-05-30 ENCOUNTER — Ambulatory Visit: Payer: Self-pay | Admitting: Family Medicine

## 2024-05-30 VITALS — BP 101/66 | HR 56 | Temp 98.1°F | Resp 16 | Ht 61.0 in | Wt 95.4 lb

## 2024-05-30 DIAGNOSIS — Z0000 Encounter for general adult medical examination without abnormal findings: Secondary | ICD-10-CM | POA: Diagnosis not present

## 2024-05-30 DIAGNOSIS — Z124 Encounter for screening for malignant neoplasm of cervix: Secondary | ICD-10-CM | POA: Diagnosis not present

## 2024-05-30 LAB — CBC WITH DIFFERENTIAL/PLATELET
Basophils Absolute: 0 K/uL (ref 0.0–0.1)
Basophils Relative: 0.6 % (ref 0.0–3.0)
Eosinophils Absolute: 0 K/uL (ref 0.0–0.7)
Eosinophils Relative: 1.4 % (ref 0.0–5.0)
HCT: 36.6 % (ref 36.0–46.0)
Hemoglobin: 12.2 g/dL (ref 12.0–15.0)
Lymphocytes Relative: 32.7 % (ref 12.0–46.0)
Lymphs Abs: 1.2 K/uL (ref 0.7–4.0)
MCHC: 33.3 g/dL (ref 30.0–36.0)
MCV: 91.7 fl (ref 78.0–100.0)
Monocytes Absolute: 0.3 K/uL (ref 0.1–1.0)
Monocytes Relative: 7.7 % (ref 3.0–12.0)
Neutro Abs: 2.1 K/uL (ref 1.4–7.7)
Neutrophils Relative %: 57.6 % (ref 43.0–77.0)
Platelets: 224 K/uL (ref 150.0–400.0)
RBC: 3.99 Mil/uL (ref 3.87–5.11)
RDW: 13.9 % (ref 11.5–15.5)
WBC: 3.6 K/uL — ABNORMAL LOW (ref 4.0–10.5)

## 2024-05-30 LAB — COMPREHENSIVE METABOLIC PANEL WITH GFR
ALT: 10 U/L (ref 0–35)
AST: 15 U/L (ref 0–37)
Albumin: 4.3 g/dL (ref 3.5–5.2)
Alkaline Phosphatase: 34 U/L — ABNORMAL LOW (ref 39–117)
BUN: 15 mg/dL (ref 6–23)
CO2: 28 meq/L (ref 19–32)
Calcium: 9.2 mg/dL (ref 8.4–10.5)
Chloride: 101 meq/L (ref 96–112)
Creatinine, Ser: 0.8 mg/dL (ref 0.40–1.20)
GFR: 87.5 mL/min (ref >60.00)
Glucose, Bld: 88 mg/dL (ref 70–99)
Potassium: 3.7 meq/L (ref 3.5–5.1)
Sodium: 137 meq/L (ref 135–145)
Total Bilirubin: 0.9 mg/dL (ref 0.2–1.2)
Total Protein: 7.4 g/dL (ref 6.0–8.3)

## 2024-05-30 LAB — TSH: TSH: 2.38 u[IU]/mL (ref 0.35–5.50)

## 2024-05-30 LAB — LIPID PANEL
Cholesterol: 242 mg/dL — ABNORMAL HIGH (ref 0–200)
HDL: 98.3 mg/dL (ref >39.00)
LDL Cholesterol: 133 mg/dL — ABNORMAL HIGH (ref 0–99)
NonHDL: 143.95
Total CHOL/HDL Ratio: 2
Triglycerides: 56 mg/dL (ref 0.0–149.0)
VLDL: 11.2 mg/dL (ref 0.0–40.0)

## 2024-05-30 LAB — HEMOGLOBIN A1C: Hgb A1c MFr Bld: 5.9 % (ref 4.6–6.5)

## 2024-05-30 NOTE — Progress Notes (Signed)
 Labs great except  Cholesterol but good one HDL is protective.  Keep working on diet/exercise A1C elevated-keep working on diet/exercise

## 2024-05-30 NOTE — Patient Instructions (Addendum)
 It was very nice to see you today!  Referral sent to gyn for pap  IB gard  meditation   PLEASE NOTE:  If you had any lab tests please let us  know if you have not heard back within a few days. You may see your results on MyChart before we have a chance to review them but we will give you a call once they are reviewed by us . If we ordered any referrals today, please let us  know if you have not heard from their office within the next week.   Please try these tips to maintain a healthy lifestyle:  Eat most of your calories during the day when you are active. Eliminate processed foods including packaged sweets (pies, cakes, cookies), reduce intake of potatoes, white bread, white pasta, and white rice. Look for whole grain options, oat flour or almond flour.  Each meal should contain half fruits/vegetables, one quarter protein, and one quarter carbs (no bigger than a computer mouse).  Cut down on sweet beverages. This includes juice, soda, and sweet tea. Also watch fruit intake, though this is a healthier sweet option, it still contains natural sugar! Limit to 3 servings daily.  Drink at least 1 glass of water with each meal and aim for at least 8 glasses per day  Exercise at least 150 minutes every week.

## 2024-05-30 NOTE — Progress Notes (Signed)
 Phone [phone]   Subjective:   Patient is a 48 y.o. female presenting for annual physical.    Chief Complaint  Patient presents with   Annual Exam    CPE Fasting    Annual-exercises Discussed the use of AI scribe software for clinical note transcription with the patient, who gave verbal consent to proceed.  History of Present Illness Colleen Doyle is a 48 year old female who presents for an annual physical exam.  She maintains regular exercise and a healthy diet, yet her cholesterol levels remain elevated, with LDL previously recorded under 130 mg/dL. Her family history is negative for heart attacks or strokes.  She experiences severe headaches primarily associated with her menstrual cycle, lasting for a couple of days. She takes magnesium daily and uses Tylenol as needed. Her periods are somewhat irregular, occurring five days early, and the headaches are described as very severe during this time.  She reports bloating and digestive issues, particularly after eating larger meals. She has a history of overeating and now tries to eat smaller meals. She underwent an EGD in [place], which showed polyps that were removed, and a colonoscopy that was normal. Certain foods, particularly in larger quantities, exacerbate her bloating.  She experiences difficulty controlling her eating habits, often eating out of boredom rather than hunger. She has lost some weight by eating smaller meals and snacks throughout the day.  She reports sleep disturbances, often waking after four hours of sleep with a busy mind. She sometimes manages to sleep well but often wakes early and cannot return to sleep. She engages in physical activity, such as running, to manage her energy levels.  No runny nose, congestion, sore throat, chest pain, heart racing, skipping beats, coughing, wheezing, shortness of breath, vomiting, diarrhea, constipation, heartburn, urination issues, or depression.    See problem  oriented charting- ROS- ROS: Gen: no fever, chills  Skin: no rash, itching ENT: no ear pain, ear drainage, nasal congestion, rhinorrhea, sinus pressure, sore throat Eyes: no blurry vision, double vision Resp: no cough, wheeze,SOB CV: no CP, palpitations, LE edema,  GI: no heartburn, n/v/d/c, abd pain GU: no dysuria, urgency, frequency, hematuria MSK: no joint pain, myalgias, back pain Neuro: no dizziness, , weakness, vertigo Psych: no depression, anxiety, insomnia, SI   The following were reviewed and entered/updated in epic: Past Medical History:  Diagnosis Date   GAD (generalized anxiety disorder)    Insomnia    Patient Active Problem List   Diagnosis Date Noted   GAD (generalized anxiety disorder) 02/01/2023   Intractable episodic tension-type headache 02/01/2023   History reviewed. No pertinent surgical history.  Family History  Problem Relation Age of Onset   Hypertension Father    Dementia Maternal Grandmother    Breast cancer Neg Hx     Medications- reviewed and updated Current Outpatient Medications  Medication Sig Dispense Refill   MAGNESIUM PO Take by mouth.     Multiple Vitamin (MULTIVITAMIN) tablet Take 1 tablet by mouth daily.     No current facility-administered medications for this visit.    Allergies-reviewed and updated Allergies  Allergen Reactions   Alcohol Hives    Social History   Social History Narrative   Not on file   Objective  Objective:  BP 101/66   Pulse (!) 56   Temp 98.1 F (36.7 C) (Temporal)   Resp 16   Ht 5' 1 (1.549 m)   Wt 95 lb 6 oz (43.3 kg)   LMP 05/01/2024 (Approximate)  SpO2 100%   BMI 18.02 kg/m  Physical Exam  Gen: WDWN NAD HEENT: NCAT, conjunctiva not injected, sclera nonicteric TM WNL B, OP moist, no exudates  NECK:  supple, no thyromegaly, no nodes, no carotid bruits CARDIAC: RRR, S1S2+, no murmur. DP 2+B LUNGS: CTAB. No wheezes ABDOMEN:  BS+, soft, NTND, No HSM, no masses EXT:  no edema MSK: no  gross abnormalities. MS 5/5 all 4 NEURO: A&O x3.  CN II-XII intact.  PSYCH: normal mood. Good eye contact     Assessment and Plan   Health Maintenance counseling: 1. Anticipatory guidance: Patient counseled regarding regular dental exams q6 months, eye exams,  avoiding smoking and second hand smoke, limiting alcohol to 1 beverage per day, no illicit drugs.   2. Risk factor reduction:  Advised patient of need for regular exercise and diet rich and fruits and vegetables to reduce risk of heart attack and stroke. Exercise- +.  Wt Readings from Last 3 Encounters:  05/30/24 95 lb 6 oz (43.3 kg)  05/16/24 98 lb 2 oz (44.5 kg)  06/16/23 99 lb 8 oz (45.1 kg)   3. Immunizations/screenings/ancillary studies Immunization History  Administered Date(s) Administered   Influenza, Quadrivalent, Recombinant, Inj, Pf 11/15/2016, 07/01/2022   Influenza,inj,Quad PF,6-35 Mos 06/30/2019   Influenza-Unspecified 07/28/2017, 07/20/2018, 07/23/2020   PFIZER(Purple Top)SARS-COV-2 Vaccination 12/22/2019, 01/16/2020, 09/03/2020   PPD Test 10/18/2018   Pfizer Covid-19 Vaccine Bivalent Booster 76yrs & up 10/09/2021   Tdap 10/13/2007, 07/29/2018   Health Maintenance Due  Topic Date Due   Hepatitis B Vaccines 19-59 Average Risk (1 of 3 - 19+ 3-dose series) Never done   Cervical Cancer Screening (HPV/Pap Cotest)  Never done   INFLUENZA VACCINE  05/12/2024    4. Cervical cancer screening- gyn 5. Breast cancer screening-  mammogram due Jan 6. Colon cancer screening - due 1 yr 7. Skin cancer screening- advised regular sunscreen use. Denies worrisome, changing, or new skin lesions.  8. Birth control/STD check- condoms 9. Osteoporosis screening- n/a 10. Smoking associated screening - non smoker  Wellness examination -     CBC with Differential/Platelet -     Comprehensive metabolic panel with GFR -     Lipid panel -     TSH -     Hemoglobin A1c  Encounter for Papanicolaou smear for cervical cancer  screening -     Ambulatory referral to Gynecology   [facility] guidance.  Work on Diet/Exercise  Check CBC,CMP,lipids,TSH, A1C.  F/u 1 yr  Assessment and Plan Assessment & Plan Adult Wellness Visit   She attended a routine adult wellness visit with no major complaints such as headache, dizziness, chest pain, or palpitations. She engages in regular exercise and maintains a healthy diet. There is no family history of myocardial infarction or cerebrovascular accidents, though managing cholesterol levels may be genetically challenging. Continue regular exercise and a healthy diet. Encourage regular health screenings and lifestyle modifications.  Menstrual-related headaches   Headaches occur a couple of days before or during menstruation, described as severe and incapacitating. Current management includes daily magnesium and occasional acetaminophen. Continue daily magnesium supplementation. Start ibuprofen a couple of days before the predicted start of menstruation. Consider migraine medication if headaches become unmanageable.  Chronic bloating and digestive discomfort   She experiences chronic bloating and digestive discomfort, possibly related to dietary habits. Previous EGD and colonoscopy were performed due to polyps, with a repeat colonoscopy due next year. Try Ibogard daily to help with bloating and gas. Refer to GI for repeat  colonoscopy next year.  Hyperlipidemia   Cholesterol management is challenging despite a healthy lifestyle, possibly due to genetic factors. Her last LDL was under 130. There is no family history of myocardial infarction or cerebrovascular accidents. Continue regular exercise and a healthy diet. Monitor cholesterol levels.  Insomnia   She has difficulty sleeping, often waking up after four hours with a busy mind, and prefers non-pharmacological approaches. Try meditation and breathing exercises to improve sleep. Consider changing environments if unable to  sleep.    Recommended follow up: Return in about 1 year (around 05/30/2025) for annual physical.  Lab/Order associations:+ fasting  Jenkins CHRISTELLA Carrel, MD

## 2024-07-18 NOTE — Progress Notes (Signed)
 48 y.o. No obstetric history on file. female here for annual exam. Married. PCP: Wendolyn Jenkins Jansky, MD   No LMP recorded.    She reports ***. Urine sample provided: ***  Abnormal bleeding: *** Pelvic discharge or pain: *** Breast mass, nipple discharge or skin changes : ***  Sexually active: *** Birth control: *** Last PAP: No results found for: DIAGPAP, HPVHIGH, ADEQPAP Last mammogram: 11/02/23 *** Last colonoscopy: *** ***  Exercising: *** Smoker: ***  Flowsheet Row Office Visit from 05/30/2024 in [facility] [place]  PHQ-2 Total Score 0    Flowsheet Row Office Visit from 06/16/2023 in [facility] [place]  PHQ-9 Total Score 2     GYN HISTORY: ***  OB History  No obstetric history on file.   Past Medical History:  Diagnosis Date   GAD (generalized anxiety disorder)    Insomnia    No past surgical history on file. Current Outpatient Medications on File Prior to Visit  Medication Sig Dispense Refill   MAGNESIUM PO Take by mouth.     Multiple Vitamin (MULTIVITAMIN) tablet Take 1 tablet by mouth daily.     No current facility-administered medications on file prior to visit.   Social History   Socioeconomic History   Marital status: Married    Spouse name: Not on file   Number of children: 1   Years of education: Not on file   Highest education level: Some college, no degree  Occupational History   Occupation: Teacher, adult education: [employer]  Tobacco Use   Smoking status: Never   Smokeless tobacco: Never   Tobacco comments:    I don't smoke at all.  Vaping Use   Vaping status: Never Used  Substance and Sexual Activity   Alcohol use: No   Drug use: No   Sexual activity: Not Currently    Birth control/protection: None  Other Topics Concern   Not on file  Social History Narrative   Not on file   Social Drivers of Health   Financial Resource Strain: Low Risk  (05/13/2024)   Overall  Financial Resource Strain (CARDIA)    Difficulty of Paying Living Expenses: Not hard at all  Food Insecurity: No Food Insecurity (05/13/2024)   Hunger Vital Sign    Worried About Running Out of Food in the Last Year: Never true    Ran Out of Food in the Last Year: Never true  Transportation Needs: No Transportation Needs (05/13/2024)   PRAPARE - Administrator, Civil Service (Medical): No    Lack of Transportation (Non-Medical): No  Physical Activity: Sufficiently Active (05/13/2024)   Exercise Vital Sign    Days of Exercise per Week: 6 days    Minutes of Exercise per Session: 60 min  Stress: Stress Concern Present (05/13/2024)   [employer] of Occupational Health - Occupational Stress Questionnaire    Feeling of Stress: To some extent  Social Connections: Moderately Integrated (05/13/2024)   Social Connection and Isolation Panel    Frequency of Communication with Friends and Family: More than three times a week    Frequency of Social Gatherings with Friends and Family: Three times a week    Attends Religious Services: 1 to 4 times per year    Active Member of Clubs or Organizations: No    Attends Banker Meetings: Not on file    Marital Status: Married  Intimate Partner Violence: Unknown (05/23/2023)  Received from [facility]   HITS    Physically Hurt: Not on file    Insult or Talk Down To: Not on file    Threaten Physical Harm: Not on file    Scream or Curse: Not on file   Family History  Problem Relation Age of Onset   Hypertension Father    Dementia Maternal Grandmother    Breast cancer Neg Hx    Allergies  Allergen Reactions   Alcohol Hives     PE There were no vitals filed for this visit. There is no height or weight on file to calculate BMI.  Physical Exam    Assessment and Plan:        There are no diagnoses linked to this encounter. Clotilda FORBES Pa, CMA

## 2024-07-19 ENCOUNTER — Other Ambulatory Visit (HOSPITAL_COMMUNITY)
Admission: RE | Admit: 2024-07-19 | Discharge: 2024-07-19 | Disposition: A | Source: Ambulatory Visit | Attending: Obstetrics and Gynecology | Admitting: Obstetrics and Gynecology

## 2024-07-19 ENCOUNTER — Encounter: Payer: Self-pay | Admitting: Obstetrics and Gynecology

## 2024-07-19 ENCOUNTER — Ambulatory Visit (INDEPENDENT_AMBULATORY_CARE_PROVIDER_SITE_OTHER): Admitting: Obstetrics and Gynecology

## 2024-07-19 VITALS — BP 96/60 | HR 62 | Temp 97.7°F | Ht 60.0 in | Wt 96.2 lb

## 2024-07-19 DIAGNOSIS — Z1331 Encounter for screening for depression: Secondary | ICD-10-CM | POA: Diagnosis not present

## 2024-07-19 DIAGNOSIS — Z124 Encounter for screening for malignant neoplasm of cervix: Secondary | ICD-10-CM | POA: Insufficient documentation

## 2024-07-19 DIAGNOSIS — Z01419 Encounter for gynecological examination (general) (routine) without abnormal findings: Secondary | ICD-10-CM | POA: Insufficient documentation

## 2024-07-19 NOTE — Assessment & Plan Note (Signed)
 Cervical cancer screening performed according to ASCCP guidelines. Encouraged annual mammogram screening Colonoscopy never, however cologuard UTD  Labs and immunizations with her primary Encouraged safe sexual practices as indicated Encouraged healthy lifestyle practices with diet and exercise For patients under 48yo, I recommend 1000mg  calcium daily and 600IU of vitamin D  daily.

## 2024-07-19 NOTE — Patient Instructions (Signed)

## 2024-07-24 LAB — CYTOLOGY - PAP
Comment: NEGATIVE
Diagnosis: NEGATIVE
High risk HPV: NEGATIVE

## 2024-07-25 ENCOUNTER — Ambulatory Visit: Payer: Self-pay | Admitting: Obstetrics and Gynecology

## 2024-09-27 ENCOUNTER — Other Ambulatory Visit (HOSPITAL_BASED_OUTPATIENT_CLINIC_OR_DEPARTMENT_OTHER): Payer: Self-pay | Admitting: Family Medicine

## 2024-09-27 DIAGNOSIS — Z1231 Encounter for screening mammogram for malignant neoplasm of breast: Secondary | ICD-10-CM

## 2024-10-27 ENCOUNTER — Ambulatory Visit: Admitting: Family Medicine

## 2024-10-27 ENCOUNTER — Encounter: Payer: Self-pay | Admitting: Family Medicine

## 2024-10-27 VITALS — BP 118/72 | HR 68 | Temp 97.2°F | Ht 60.0 in | Wt 98.1 lb

## 2024-10-27 DIAGNOSIS — M542 Cervicalgia: Secondary | ICD-10-CM | POA: Diagnosis not present

## 2024-10-27 DIAGNOSIS — R42 Dizziness and giddiness: Secondary | ICD-10-CM | POA: Diagnosis not present

## 2024-10-27 DIAGNOSIS — R140 Abdominal distension (gaseous): Secondary | ICD-10-CM

## 2024-10-27 NOTE — Progress Notes (Unsigned)
 "  Subjective:     Patient ID: Colleen Doyle, female    DOB: 12/18/1975, 49 y.o.   MRN: [id number]  Chief Complaint  Patient presents with   Dizziness    Pt has had dizziness x 1 month, also has tightness in left shoulder and some pain/tightness in chest    Discussed the use of AI scribe software for clinical note transcription with the patient, who gave verbal consent to proceed.  History of Present Illness Colleen Doyle is a 49 year old female who presents with shoulder tightness and dizziness.  She experiences persistent tightness in her left shoulder-long time, sometimes extending to the front of her chest. The sensation is described as 'super tight' rather than painful and worsens with nervousness. The tightness is not reproducible with movement or pressure, but she finds relief through massage. She has not pursued physical therapy but performs self-directed exercises and stretches regularly.  She has been experiencing dizziness for the past one to two months, describing it as a spinning sensation that occurs primarily towards the end of her workday. She experienced a near-syncope episode at work, which resolved after resting and hydrating. The dizziness is exacerbated by head movements and is accompanied by a lack of energy, but not by headaches or visual disturbances. She continues to work 12-hour shifts despite these symptoms.  She has a history of anxiety and previously took Buspar , which she reported reduced some symptoms but did not completely resolve her tightness. She has not taken it for a year, and her anxiety symptoms, including shoulder tightness, have returned.  She reports ongoing bloating, which persists despite trying various medications. She has not experienced heartburn, and her symptoms are not worsening.  No chest pain, shortness of breath, cough, blurry vision, double vision, sweating, and heartburn. She reports dizziness, especially when moving her head, and  tightness in the neck, shoulder, and chest areas. She experiences anxiety-related symptoms and has a history of anxiety medication use.    There are no preventive care reminders to display for this patient.   Past Medical History:  Diagnosis Date   GAD (generalized anxiety disorder)    Insomnia     History reviewed. No pertinent surgical history.  Current Medications[1]  Allergies[2] ROS neg/noncontributory except as noted HPI/below      Objective:     BP 118/72 (BP Location: Left Arm, Patient Position: Sitting, Cuff Size: Normal)   Pulse 68   Temp (!) 97.2 F (36.2 C) (Temporal)   Ht 5' (1.524 m)   Wt 98 lb 2 oz (44.5 kg)   LMP 10/24/2024   SpO2 98%   BMI 19.16 kg/m  Wt Readings from Last 3 Encounters:  10/27/24 98 lb 2 oz (44.5 kg)  07/19/24 96 lb 3.2 oz (43.6 kg)  05/30/24 95 lb 6 oz (43.3 kg)    Physical Exam GENERAL: Well developed, well nourished, no acute distress. HEAD EYES EARS NOSE THROAT: Normocephalic, atraumatic, conjunctiva not injected, sclera nonicteric. CARDIAC: Regular rate and rhythm, S1 S2 present, no murmur, dorsalis pedis 2 plus bilaterally. NECK: Supple, no thyromegaly, no nodes, no carotid bruits, neck muscles tight B, normal range of motion. LUNGS: Clear to auscultation bilaterally, no wheezes. ABDOMEN: Bowel sounds present, soft, non-tender, non-distended, no hepatosplenomegaly, no masses. EXTREMITIES: No edema. MUSCULOSKELETAL: No gross abnormalities. NEUROLOGICAL: Alert and oriented x3, cranial nerves II through XII intact, normal finger to nose test, neck and shoulder muscles tight, neurological exam normal. PSYCHIATRIC: Normal mood, good eye contact.  Assessment & Plan:  Dizziness  Cervicalgia  Abdominal bloating -     Ambulatory referral to Gastroenterology    Assessment and Plan Assessment & Plan Dizziness and vertigo   She experiences intermittent dizziness and vertigo for 1.5 months, worsened by head movement,  with near syncope episodes x1. The differential includes vestibular migraines and inner ear issues, spine, other. There is no associated headache, sweating, or heartburn. Symptoms improve with hydration and rest. Perform Epley maneuvers at home for inner ear exercises. Consider MRI if symptoms persist or worsen.  Cervical and shoulder myofascial pain with muscle tension   She has chronic cervical and shoulder myofascial pain with muscle tension, exacerbated by stress and anxiety. The pain is tight and non-reproducible with palpation. There has been no prior physical therapy or orthopedic evaluation. Self-massage and exercises provide limited relief. Recommended massage therapy at [facility] and consider acupuncture for additional relief. Continue self-massage and stretching exercises. Use a lacrosse ball for self-massage on pressure points. Consider using lavender oil for relaxation.  PT referral if changes mind.  Worse, no change, f/u  Generalized anxiety disorder   Anxiety symptoms have returned after discontinuation of Buspar  for a year, including nervousness, difficulty focusing, and sleep disturbances. Previous Buspar  use was effective. Discussed flexible dosing options for Buspar , including as-needed use. Restart Buspar  with flexible dosing, such as 10 mg at night or 5 mg twice daily as needed. Encouraged meditation and relaxation techniques. Consider lavender oil for relaxation.  Abdominal bloating   She has persistent abdominal bloating despite dietary modifications and previous medication trials, with no significant improvement. The differential includes gastrointestinal issues. Referred to gastroenterology for further evaluation.     Return if symptoms worsen or fail to improve.  Jenkins CHRISTELLA Carrel, MD     [1]  Current Outpatient Medications:    MAGNESIUM PO, Take by mouth., Disp: , Rfl:    Multiple Vitamin (MULTIVITAMIN) tablet, Take 1 tablet by mouth daily., Disp: , Rfl:  [2]   Allergies Allergen Reactions   Alcohol Hives   "

## 2024-10-27 NOTE — Patient Instructions (Addendum)
 It was very nice to see you today!  [facility] discount.  Acupuncture  If not better or worse, let me know and will do MRI.   Restart Buspar     PLEASE NOTE:  If you had any lab tests please let us  know if you have not heard back within a few days. You may see your results on MyChart before we have a chance to review them but we will give you a call once they are reviewed by us . If we ordered any referrals today, please let us  know if you have not heard from their office within the next week.   Please try these tips to maintain a healthy lifestyle:  Eat most of your calories during the day when you are active. Eliminate processed foods including packaged sweets (pies, cakes, cookies), reduce intake of potatoes, white bread, white pasta, and white rice. Look for whole grain options, oat flour or almond flour.  Each meal should contain half fruits/vegetables, one quarter protein, and one quarter carbs (no bigger than a computer mouse).  Cut down on sweet beverages. This includes juice, soda, and sweet tea. Also watch fruit intake, though this is a healthier sweet option, it still contains natural sugar! Limit to 3 servings daily.  Drink at least 1 glass of water with each meal and aim for at least 8 glasses per day  Exercise at least 150 minutes every week.

## 2024-11-03 ENCOUNTER — Encounter: Payer: Self-pay | Admitting: Gastroenterology

## 2024-11-07 ENCOUNTER — Ambulatory Visit: Admitting: Family Medicine

## 2024-11-10 ENCOUNTER — Encounter (HOSPITAL_BASED_OUTPATIENT_CLINIC_OR_DEPARTMENT_OTHER): Payer: Self-pay | Admitting: Radiology

## 2024-11-10 ENCOUNTER — Ambulatory Visit (HOSPITAL_BASED_OUTPATIENT_CLINIC_OR_DEPARTMENT_OTHER)
Admission: RE | Admit: 2024-11-10 | Discharge: 2024-11-10 | Disposition: A | Source: Ambulatory Visit | Attending: Family Medicine | Admitting: Family Medicine

## 2024-11-10 DIAGNOSIS — Z1231 Encounter for screening mammogram for malignant neoplasm of breast: Secondary | ICD-10-CM | POA: Insufficient documentation

## 2024-11-15 ENCOUNTER — Telehealth: Payer: Self-pay | Admitting: Radiology

## 2024-11-15 ENCOUNTER — Ambulatory Visit: Payer: Self-pay | Admitting: Family Medicine

## 2024-11-15 NOTE — Telephone Encounter (Signed)
STAT read requested

## 2024-11-29 ENCOUNTER — Ambulatory Visit: Admitting: Gastroenterology

## 2025-06-07 ENCOUNTER — Encounter: Admitting: Family Medicine

## 2025-07-20 ENCOUNTER — Encounter: Admitting: Family Medicine

## 2025-07-23 ENCOUNTER — Ambulatory Visit: Admitting: Obstetrics and Gynecology
